# Patient Record
Sex: Male | Born: 1935 | Race: White | Hispanic: No | Marital: Married | Smoking: Never smoker
Health system: Southern US, Community
[De-identification: ages and names within clinical notes are randomized; demographics above are authoritative.]

## PROBLEM LIST (undated history)

## (undated) DIAGNOSIS — N3001 Acute cystitis with hematuria: Secondary | ICD-10-CM

## (undated) DIAGNOSIS — N400 Enlarged prostate without lower urinary tract symptoms: Secondary | ICD-10-CM

## (undated) DIAGNOSIS — E785 Hyperlipidemia, unspecified: Secondary | ICD-10-CM

## (undated) DIAGNOSIS — G9340 Encephalopathy, unspecified: Secondary | ICD-10-CM

## (undated) DIAGNOSIS — R5383 Other fatigue: Secondary | ICD-10-CM

## (undated) DIAGNOSIS — M159 Polyosteoarthritis, unspecified: Secondary | ICD-10-CM

## (undated) DIAGNOSIS — D72825 Bandemia: Secondary | ICD-10-CM

## (undated) DIAGNOSIS — Z9181 History of falling: Secondary | ICD-10-CM

## (undated) DIAGNOSIS — I10 Essential (primary) hypertension: Secondary | ICD-10-CM

## (undated) DIAGNOSIS — R7303 Prediabetes: Secondary | ICD-10-CM

## (undated) DIAGNOSIS — R4182 Altered mental status, unspecified: Secondary | ICD-10-CM

## (undated) DIAGNOSIS — R5381 Other malaise: Secondary | ICD-10-CM

## (undated) DIAGNOSIS — D638 Anemia in other chronic diseases classified elsewhere: Secondary | ICD-10-CM

## (undated) DIAGNOSIS — R001 Bradycardia, unspecified: Secondary | ICD-10-CM

## (undated) DIAGNOSIS — I119 Hypertensive heart disease without heart failure: Secondary | ICD-10-CM

## (undated) DIAGNOSIS — G4733 Obstructive sleep apnea (adult) (pediatric): Secondary | ICD-10-CM

## (undated) DIAGNOSIS — A419 Sepsis, unspecified organism: Secondary | ICD-10-CM

## (undated) DIAGNOSIS — Z79899 Other long term (current) drug therapy: Secondary | ICD-10-CM

## (undated) DIAGNOSIS — R339 Retention of urine, unspecified: Secondary | ICD-10-CM

## (undated) DIAGNOSIS — N390 Urinary tract infection, site not specified: Secondary | ICD-10-CM

## (undated) DIAGNOSIS — I2510 Atherosclerotic heart disease of native coronary artery without angina pectoris: Secondary | ICD-10-CM

## (undated) DIAGNOSIS — K208 Other esophagitis: Secondary | ICD-10-CM

## (undated) DIAGNOSIS — N319 Neuromuscular dysfunction of bladder, unspecified: Secondary | ICD-10-CM

## (undated) DIAGNOSIS — G20 Parkinson's disease: Secondary | ICD-10-CM

## (undated) HISTORY — DX: Atherosclerotic heart disease of native coronary artery without angina pectoris: I25.10

## (undated) HISTORY — PX: PROSTATE SURGERY: SHX751

## (undated) HISTORY — DX: Bradycardia, unspecified: R00.1

## (undated) HISTORY — DX: Altered mental status, unspecified: R41.82

## (undated) HISTORY — DX: Retention of urine, unspecified: R33.9

## (undated) HISTORY — DX: History of falling: Z91.81

## (undated) HISTORY — DX: Benign prostatic hyperplasia without lower urinary tract symptoms: N40.0

## (undated) HISTORY — PX: INGUINAL HERNIA REPAIR: SUR1180

## (undated) HISTORY — DX: Hypertensive heart disease without heart failure: I11.9

## (undated) HISTORY — DX: Other long term (current) drug therapy: Z79.899

## (undated) HISTORY — DX: Obstructive sleep apnea (adult) (pediatric): G47.33

## (undated) HISTORY — DX: Other esophagitis: K20.8

## (undated) HISTORY — DX: Hyperlipidemia, unspecified: E78.5

## (undated) HISTORY — DX: Essential (primary) hypertension: I10

## (undated) HISTORY — DX: Anemia in other chronic diseases classified elsewhere: D63.8

## (undated) HISTORY — DX: Prediabetes: R73.03

## (undated) HISTORY — DX: Other fatigue: R53.83

## (undated) HISTORY — DX: Encephalopathy, unspecified: G93.40

## (undated) HISTORY — DX: Neuromuscular dysfunction of bladder, unspecified: N31.9

## (undated) HISTORY — DX: Polyosteoarthritis, unspecified: M15.9

## (undated) HISTORY — PX: CORONARY ANGIOPLASTY WITH STENT PLACEMENT: SHX49

## (undated) HISTORY — DX: Urinary tract infection, site not specified: N39.0

## (undated) HISTORY — DX: Bandemia: D72.825

## (undated) HISTORY — DX: Other malaise: R53.81

## (undated) HISTORY — PX: KNEE SURGERY: SHX244

## (undated) HISTORY — DX: Sepsis, unspecified organism: A41.9

## (undated) HISTORY — DX: Acute cystitis with hematuria: N30.01

## (undated) HISTORY — DX: Parkinson's disease: G20

## (undated) HISTORY — PX: CATARACT EXTRACTION, BILATERAL: SHX1313

---

## 2005-06-22 ENCOUNTER — Inpatient Hospital Stay (HOSPITAL_COMMUNITY): Admission: AD | Admit: 2005-06-22 | Discharge: 2005-06-25 | Payer: Self-pay | Admitting: Cardiology

## 2005-06-23 ENCOUNTER — Ambulatory Visit: Payer: Self-pay | Admitting: Cardiology

## 2005-07-05 ENCOUNTER — Ambulatory Visit: Payer: Self-pay | Admitting: Cardiology

## 2005-07-24 ENCOUNTER — Ambulatory Visit: Payer: Self-pay | Admitting: Internal Medicine

## 2005-09-18 ENCOUNTER — Ambulatory Visit: Payer: Self-pay | Admitting: Cardiology

## 2005-12-05 ENCOUNTER — Ambulatory Visit: Payer: Self-pay | Admitting: Cardiology

## 2006-03-15 ENCOUNTER — Ambulatory Visit: Payer: Self-pay | Admitting: Cardiology

## 2006-07-17 ENCOUNTER — Ambulatory Visit: Payer: Self-pay | Admitting: Cardiology

## 2006-07-30 ENCOUNTER — Inpatient Hospital Stay (HOSPITAL_COMMUNITY): Admission: RE | Admit: 2006-07-30 | Discharge: 2006-08-02 | Payer: Self-pay | Admitting: Orthopedic Surgery

## 2006-07-30 ENCOUNTER — Ambulatory Visit: Payer: Self-pay | Admitting: Cardiology

## 2007-04-18 ENCOUNTER — Ambulatory Visit: Payer: Self-pay | Admitting: Cardiology

## 2007-05-27 ENCOUNTER — Inpatient Hospital Stay (HOSPITAL_COMMUNITY): Admission: RE | Admit: 2007-05-27 | Discharge: 2007-05-29 | Payer: Self-pay | Admitting: Orthopedic Surgery

## 2007-09-20 IMAGING — CR DG CHEST 2V
2 series · 2 of 2 positions shown · non-contrast
Comparison: None.

CLINICAL DATA: Preop radiograph right total knee arthroplasty.  
 CHEST - 2 VIEW:

[view not recorded (1 of 2)]
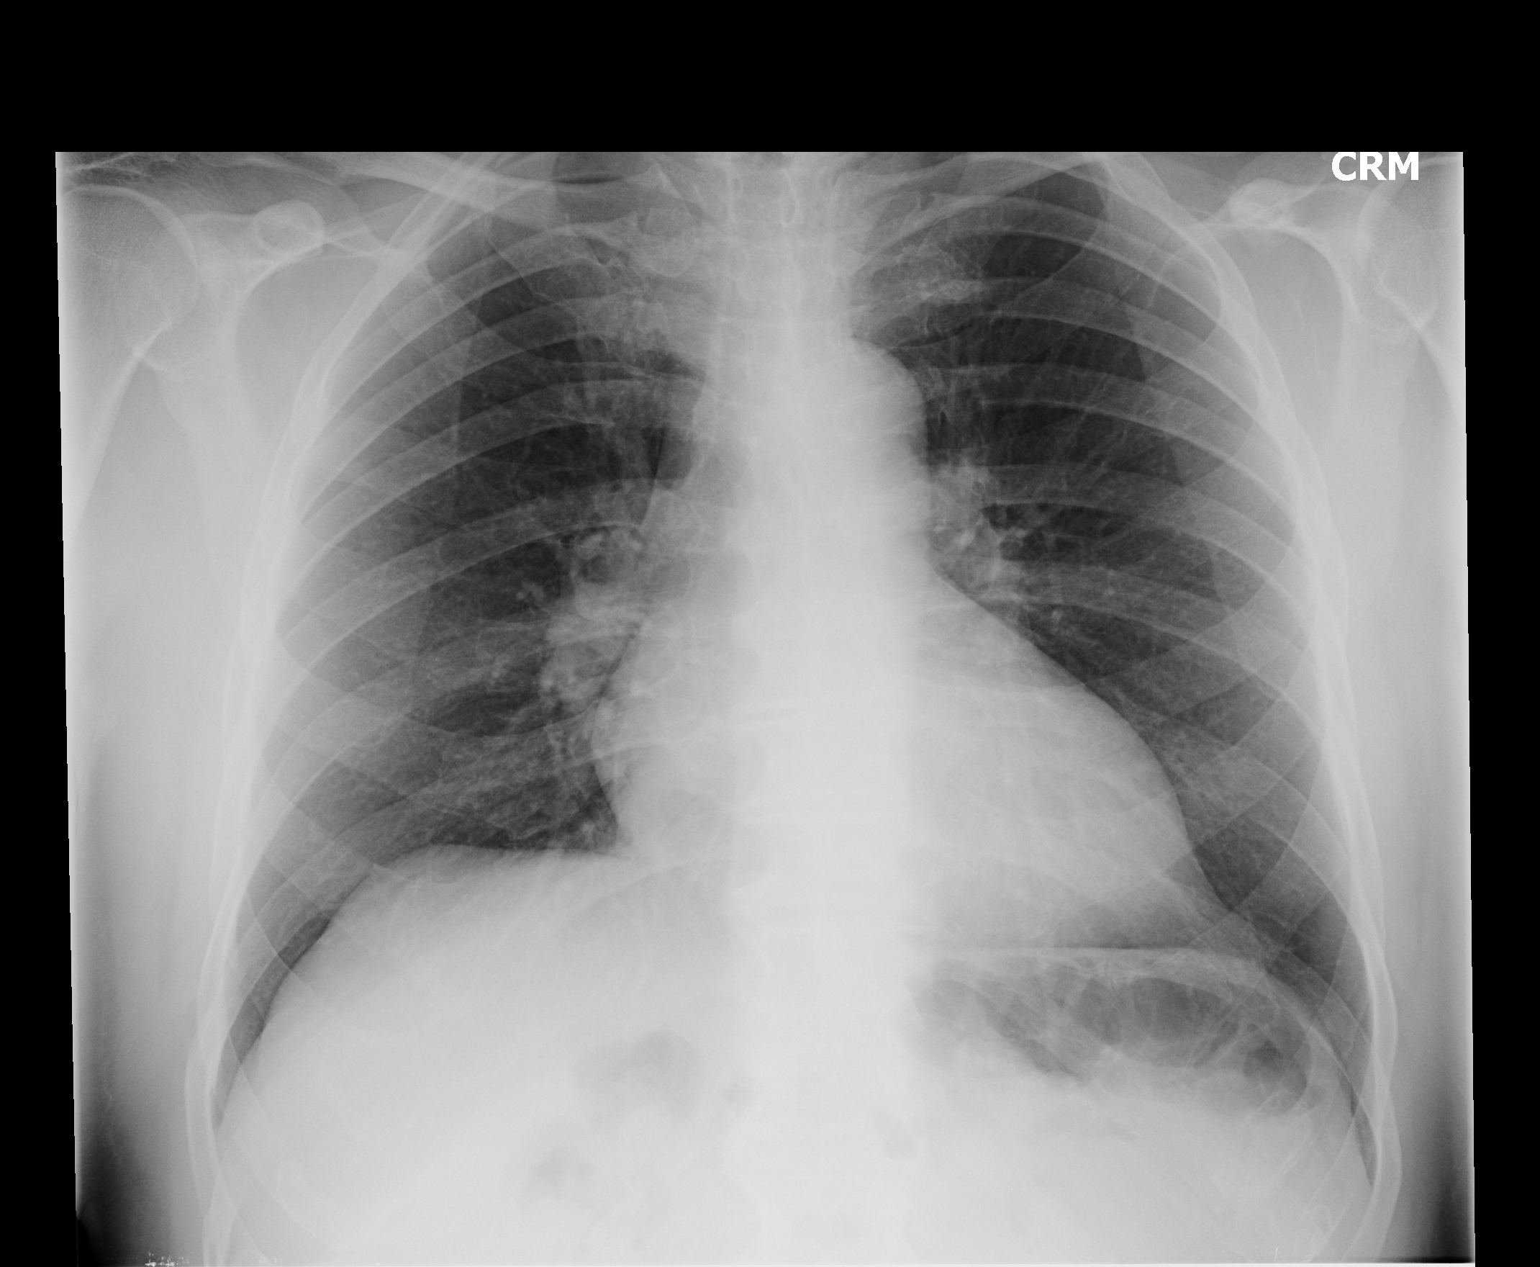

[view not recorded (2 of 2)]
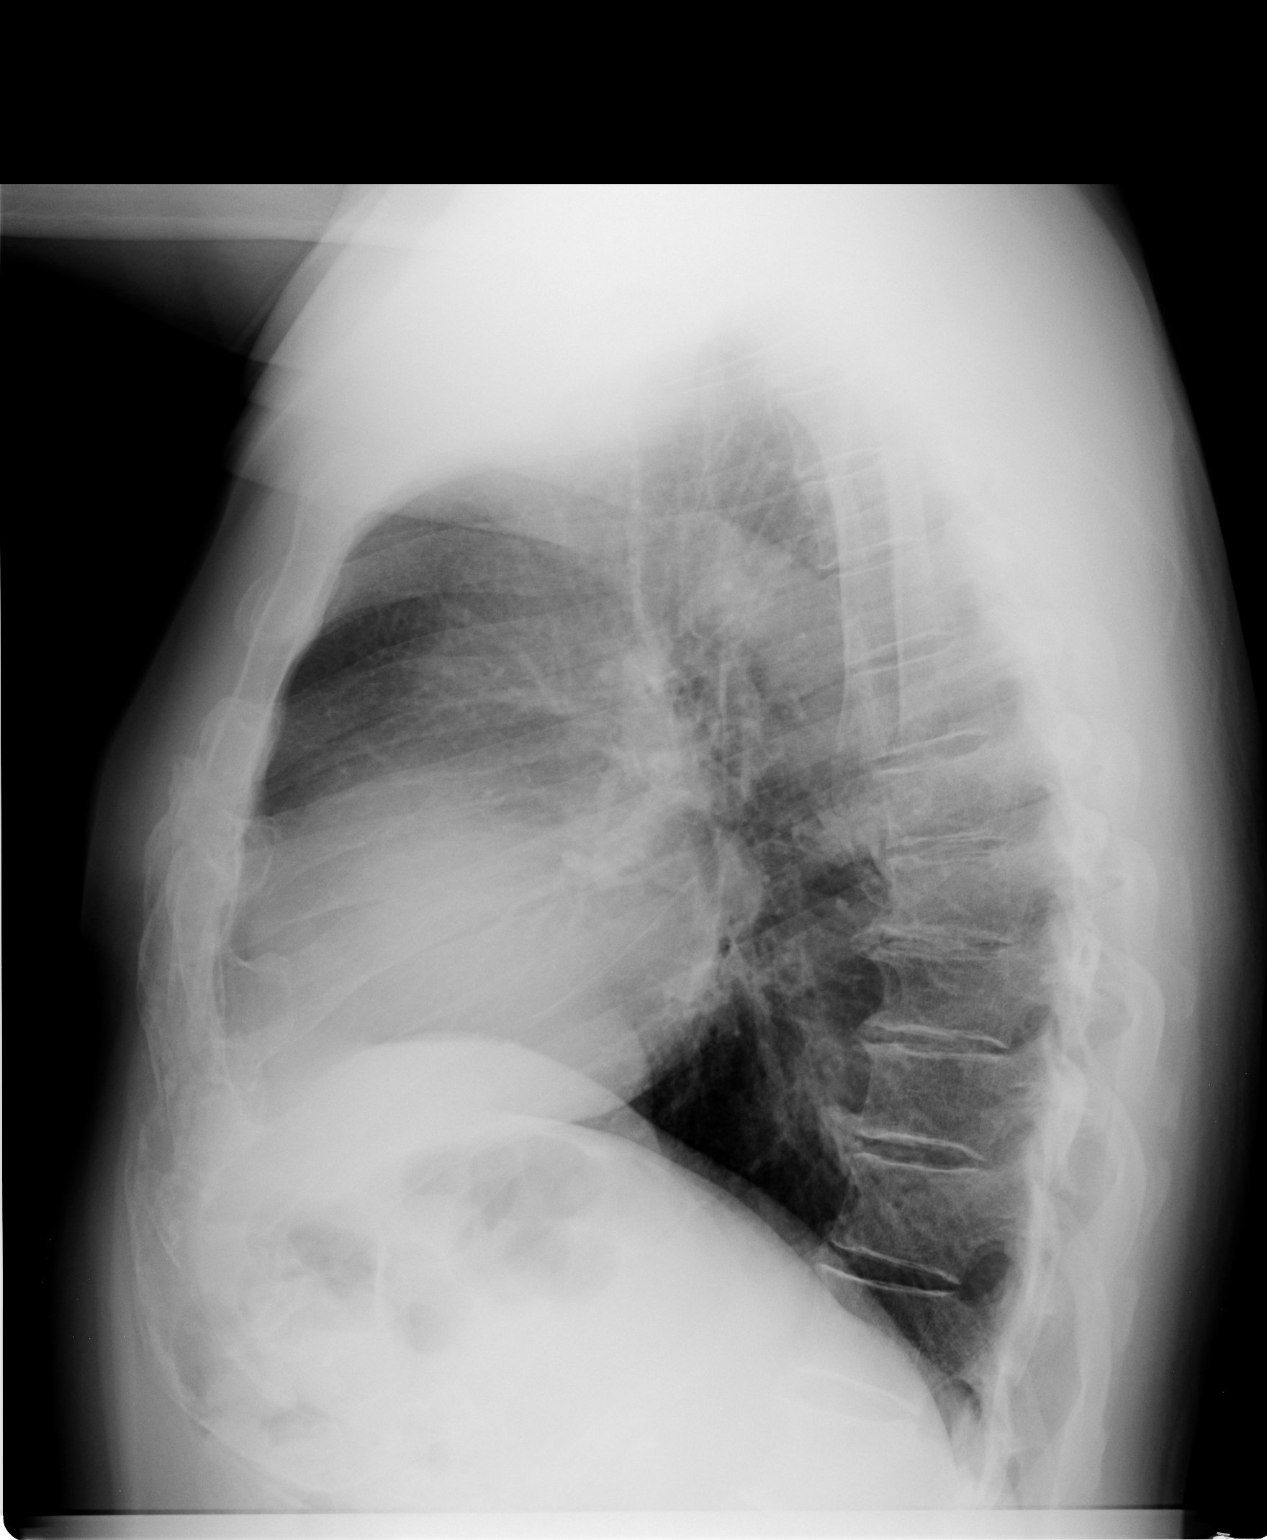

[2 of 2 positions shown; findings below may reference images not displayed]

FINDINGS: The heart size is mildly enlarged.  There is no effusions and no interstitial edema.  No air space opacities are noted.  
 There is mild chronic bronchitic changes noted.
IMPRESSION: 1.  No active cardiopulmonary disease. 
 2.  Mild cardiomegaly.  No failure.

## 2011-03-14 NOTE — Op Note (Signed)
NAME:  [name] [name], [name] [name]                 ACCOUNT NO.:  [id number]   MEDICAL RECORD NO.:  [id number]          PATIENT TYPE:  INP   LOCATION:  2550                         FACILITY:  [facility]   PHYSICIAN:  Robert A. Thurston Hole, M.D. DATE OF BIRTH:  1934/01/11   DATE OF PROCEDURE:  05/27/2007  DATE OF DISCHARGE:                               OPERATIVE REPORT   PREOPERATIVE DIAGNOSIS:  Left knee degenerative joint disease.   POSTOPERATIVE DIAGNOSIS:  Left knee degenerative joint disease.   PROCEDURE:  Left total knee replacement using DePuy cemented total knee  system with #4 cemented femur, #4 cemented tibia with 12.5 mm  polyethylene RP tibial spacer, 35 mm polyethylene cemented patella.   SURGEON:  Elana Alm. Thurston Hole, M.D.   ASSISTANT:  Julien Girt, P.A.   ANESTHESIA:  General.   OPERATIVE TIME:  1 hour 30 minutes.   COMPLICATIONS:  None.   DESCRIPTION OF PROCEDURE:  Mr. Liddy is brought to operating room on May 27, 2007 after a femoral nerve block was placed in holding by  anesthesia.  He is placed on the operative table in the supine  position.Marland Kitchen  He received Ancef 2 grams IV preoperatively for prophylaxis.  After being placed under general anesthesia, his left knee was examined.  Range of motion from minus 5 to 125 degrees with mild varus deformity.  Knee stable.  Ligamentous exam with normal patellar tracking.  He had a  Foley catheter placed under sterile conditions.  His left leg was then  prepped using sterile DuraPrep and draped using sterile technique.  Leg  was exsanguinated and thigh tourniquet elevated to 365 mm.   Initially through a 15 cm longitudinal incision based over the patella,  initial exposure was made.  Underlying subcutaneous tissues were incised  along with skin incision.  A median arthrotomy was performed revealing  an excessive amount of normal-appearing joint fluid.  The articular  surfaces were inspected.  He had grade 4 changes medially, grade 3  changes laterally and grade 3 and 4 changes in the patellofemoral joint.  Osteophytes removed from the femoral condyles and tibial plateau.  The  medial and lateral meniscal remnants were removed as well as the  anterior cruciate ligament.  An intramedullary drill was then drilled up  the femoral canal for placement of the distal femoral cutting jig which  was placed in the appropriate amount of rotation and the distal 12 mm  cut was made.  The distal femur was incised.  The #4 was found be the  appropriate size.  The #4 cutting jig was placed and these cuts were  made.  After this was done, the tibial spines were removed with an  oscillating saw.  Intramedullary drill was drilled down the tibial canal  for placement of proximal tibial cutting jig which was placed in the  appropriate amount of rotation.  The proximal 4 mm cut was made based  off the medial or lower side.  Spacer blocks were then placed in flexion  and extension.  The 12.5 mm blocks were placed,  gave excellent  balancing, excellent  stability, and excellent correction of his flexion  and varus deformities. The #4 tibial base plate trial was then placed on  the cut  tibial surface with an excellent fit and the keel cut was made.  The PCL box cutter was then placed on the distal femur and these cuts  were made.  At this point, the #4 femoral trial was placed.  A #4 tibial  base plate trial with a 12.5 mm polyethylene RP tibial spacer, knee was  reduced, taken through range of motion from 0 to 125 degrees with  excellent stability and excellent correction of his flexion and varus  deformities.  The patella was incised in a resurfacing 10 mm cut was  made and three locking holes placed for a 35 mm patella.  The patella  trial was placed and patellofemoral tracking was evaluated and found to  be normal.  At this point it was felt that all trial components were  excellent size and stability.  They were then removed.  The knee  was  then jet lavaged, irrigated with  3 L of saline.  The proximal tibia was  then exposed.  The #4 tibial base plate with cement backing was hammered  into position with an excellent fit with excess cement being removed  from around the edges.  The #4 femoral component with cement backing was  hammered into position also with an excellent fit with excess cement  being removed from around the edges.  The 12.5 mm polyethylene RP tibial  spacer was placed on the tibial base plate, knee reduced, taken through  range of motion from 0 to 125 degrees with excellent stability and  excellent correction of his flexion and varus deformities.  The 35 mm  polyethylene cement backed patella was then placed in its position and  held there with a clamp.  After this cement hardened, patellofemoral  tracking was again evaluated and this was found be normal.  At this  point, it was felt that all components were excellent size, fit and  stability.  The wound was further irrigated with saline.  Heme  tourniquet was released.  Hemostasis obtained with cautery.  The  arthrotomy was then closed with #1 Ethilon suture over two medium  Hemovac drains.  Subcutaneous tissues closed with 0 and 2-0 Vicryl.  Subcuticular layer closed with 4-0 Monocryl.  Steri-Strips, long-leg  dressing were applied.  The patient then awakened, extubated and taken  to recovery in stable condition.  Needle and sponge counts correct x2 at  the end of the case.  Neurovascular status normal as well.      Robert A. Thurston Hole, M.D.  Electronically Signed     RAW/MEDQ  D:  05/27/2007  T:  05/27/2007  Job:  [id number]

## 2011-03-14 NOTE — Letter (Signed)
April 18, 2007    Robert A. Thurston Hole, M.D.  [address]  [address]  [place], [place] [address]  FAX:  [phone]  Attn:  Olegario Messier   RE:  [name], [name]  MRN:  [id number]  /  DOB:  [birth year]   Dear Dr. Thurston Hole:   Thank you for referring [name] [name] for preoperative cardiac evaluation  before planned left total knee replacement.  [name] [name] is a 77 year old  gentleman who suffered unstable angina in August of 2006.  I placed a  drug-eluting stent in the mid portion of his circumflex artery.  He has  done very nicely since then.  He is able to go up and down a flight of  stairs and mow his lawn without any dyspnea or chest discomfort.  He  tolerated a right total knee replacement in October without any cardiac  compromise.  He denies any symptoms of orthopnea, paroxysmal nocturnal  dyspnea, palpitations, syncope, pre-syncope, claudication.   PAST MEDICAL HISTORY:  Remarkable for atherosclerotic coronary artery  disease, which is status post non-ST-elevation myocardial infarction in  August of 2006.  He has hypertension, hypercholesterolemia,  osteoarthritis of the knees, and an esophageal stricture.  He has no  known drug allergies.   CURRENT MEDICATIONS:  1. Aspirin 81 mg, which he takes 4 times daily.  2. Fish oil 1000 mg daily.  3. Lisinopril 20 mg daily.  4. Toprol XL 25 mg daily.  5. Crestor 40 mg daily.  6. Plavix 75 mg daily.  7. Vitamin B12 daily.  8. He has sublingual nitroglycerin, which he has not used.   The patient is retired and married.  He does not smoke and drinks  minimal alcohol.   FAMILY HISTORY:  Noncontributory.   REVIEW OF SYSTEMS:  Negative in detail, except as above, with the  exception of left knee discomfort with walking and standing.   PHYSICAL EXAMINATION:  He is generally well-appearing in no distress.  Heart rate is 55, blood pressure 167/73, and weight of 184 pounds.  He has no jugular venous distension, thyromegaly, or lymphadenopathy.  Respiratory effort  is normal.  Lungs are clear to auscultation.  He has a nondisplaced point of maximal impulse.  There is a regular rate  and rhythm without murmur, rub, or gallop.  ABDOMEN:  Soft, nondistended, nontender.  There is no  hepatosplenomegaly.  Bowel sounds are normal.  There is no abdominal  pulsatile mass.  EXTREMITIES:  Warm without clubbing, cyanosis, edema, or ulceration.  Carotid pulses are 2+ bilaterally without bruit.  He is alert and  oriented x3 with normal neurological exam and normal affect.  SKIN:  Normal.  HEENT:  Normal.  MUSCULOSKELETAL:  Normal, except for some crepitus with motion of his  left knee.   ELECTROCARDIOGRAM:  Demonstrates sinus bradycardia and is otherwise  normal.   SUMMARY:  [name] [name] is doing nicely from a cardiac viewpoint.  He is,  therefore, at low risk for perioperative cardiac event.  No further risk  stratification is necessary.  He should, again, be maintained on his  Toprol XL, including taking it on the morning of the procedure.  He  should also be maintained on his Crestor.  His Plavix should be  discontinued 7 days prior to the procedure and resumed as soon as safe  thereafter.  If acceptable from a surgical viewpoint, I would continue  his aspirin.  If not, discontinue it at the same time as the Plavix.   Thank you for  the opportunity to participate in his care once again.    Sincerely,      Salvadore Farber, MD  Electronically Signed    WED/MedQ  DD: 04/18/2007  DT: 04/18/2007  Job #: [id number]

## 2011-03-14 NOTE — Op Note (Signed)
NAME:  [name], [name]                 ACCOUNT NO.:  [id number]   MEDICAL RECORD NO.:  [id number]          PATIENT TYPE:  INP   LOCATION:  2550                         FACILITY:  [facility]   PHYSICIAN:  Robert A. Thurston Hole, M.D. DATE OF BIRTH:  12-22-1933   DATE OF PROCEDURE:  DATE OF DISCHARGE:                               OPERATIVE REPORT   Audio too short to transcribe (less than 5 seconds)      Robert A. Thurston Hole, M.D.     RAW/MEDQ  D:  05/27/2007  T:  05/27/2007  Job:  [id number]

## 2011-03-17 NOTE — Consult Note (Signed)
NAME:  [name] [name], [name] [name]                 ACCOUNT NO.:  [id number]   MEDICAL RECORD NO.:  [id number]          PATIENT TYPE:  INP   LOCATION:  5036                         FACILITY:  [facility]   PHYSICIAN:  Sigmund I. Patsi Sears, M.D.DATE OF BIRTH:  [birth year]   DATE OF CONSULTATION:  DATE OF DISCHARGE:                                   CONSULTATION   SUBJECTIVE:  This is a 77 year old male, consulted today because of gross  hematuria postoperatively.  The patient had a right total knee replacement  this morning, with Foley catheter placed preoperatively.  The Foley catheter  placement was somewhat difficult, but was not considered traumatic.  No  blood was noted.  The Foley balloon inflated easily, and clear urine was  obtained for the entire procedure.  When the cuff was deflated around the  leg, the patient began to immediately have gross hematuria with a few clots.  Urology is consulted.   The past history is significant for:  1. TURP by Dr. Dannette Barbara in the distant past.  2. Microhematuria, status post IVP and cystoscopy by Dr. Annabell Howells in 2001,      with the finding of post TURP prostate, but no evidence of bladder,      stone, tumor, or diverticular formation.   The patient notes that he has a history of some mild decrease in his urinary  stream, but no gross hematuria, no frequency, urgency, or dysuria.   Past history is also significant for significant cardiac disease with  unstable angina.  He has a drug-eluting stent in the mid portion of the  circumflex coronary artery (Dr. Samule Ohm).  The patient has been on Plavix and  aspirin, but was asked to continue aspirin throughout the perioperative  course, but to discontinue Plavix 7 days prior to his total knee surgery,  per Dr. Thurston Hole.   MEDICATIONS:  1. Aspirin 81 mg 4 times a day.  2. Fish oil.  3. Zantac.  4. Lisinopril 20 mg a day.  5. Toprol-XL 25 mg a day.  6. Crestor 40 mg per day.  7. Plavix 75 mg per day (stopped).   ALLERGIES:  NONE.   PAST HISTORY:  1. Coronary artery disease.  2. BPH, post TURP.  3. Hypertension.  4. Hypercholesterolemia.  5. Esophageal stricture.  6. Osteoarthritis of both knees.  7. Cardiac disease with ejection fraction of 50%.   SOCIAL HISTORY:  The patient is retired and married.  The patient uses  minimal alcohol, and no tobacco.   FAMILY HISTORY:  Noncontributory.   REVIEW OF SYSTEMS:  Significant for some decrease in the size and force of  his urinary stream, otherwise review of systems is noncontributory.   PHYSICAL EXAMINATION:  GENERAL:  Shows a well-developed, well-nourished  white male postoperatively, in a knee brace, resting comfortably in bed.  Foley catheter is in place, with gross hematuria noted.  VITAL SIGNS:  The blood pressure is 120/78, heart rate 55.  NECK:  Supple, nontender.  CHEST:  Clear to P&A.  ABDOMEN:  Soft, positive bowel sounds.  No organomegaly or masses.  GENITOURINARY:  Shows no evidence of edema.  There is no bruising.  The  penis is uncircumcised.  The glans is normal.  Foley catheter is removed.  Testicles measure 3 x 4 cm and nontender.  RECTAL:  Examination not indicated this examination.  SKIN:  Normal.  PSYCHOLOGIC:  Shows normal orientation to time, person, and place.   Flexible cystoscopy was accomplished, it shows the patient has regrowth of  prostate, with bladder neck contracture.  The flexible scope is negotiated  over the contractured area, guide wire is placed.  An 6 Council catheter is  passed over the guide wire into the bladder, and 10 mL of saline are placed  in the balloon.  The patient tolerated the procedure well.  I have discussed  the case with Dr. Thurston Hole.  He will continue his Foley catheterization until  he is able to stand to void.  We will start him on Flomax 0.4 mg 1 p.o. per  day.      Sigmund I. Patsi Sears, M.D.  Electronically Signed     SIT/MEDQ  D:  07/30/2006  T:  07/30/2006  Job:   [id number]   cc:   Molly Maduro A. Thurston Hole, M.D.

## 2011-03-17 NOTE — Discharge Summary (Signed)
NAME:  [name] [name], [name] [name]                 ACCOUNT NO.:  [id number]   MEDICAL RECORD NO.:  [id number]          PATIENT TYPE:  INP   LOCATION:  5036                         FACILITY:  [facility]   PHYSICIAN:  Robert A. Thurston Hole, M.D. DATE OF BIRTH:  [birth year]   DATE OF ADMISSION:  07/30/2006  DATE OF DISCHARGE:  08/02/2006                               DISCHARGE SUMMARY   ADMISSION DIAGNOSIS:  1. End stage degenerative joint disease, right knee.  2. Hypertension.  3. Coronary artery disease.  4. High cholesterol.   DISCHARGE DIAGNOSIS:  1. End stage degenerative joint disease.  2. Hypertension.  3. Coronary artery disease.  4. High cholesterol.  5. Postop blood loss anemia.  6. History of old myocardial infarction.  7. Gastroesophageal reflux with esophageal stricture.  8. Hematuria.  9. Orthostatic hypotension.  10.Benign prostatic hypertrophy and urethral stricture.   HISTORY OF PRESENT ILLNESS:  The patient is a 77 year old white male  with a history of end stage DJD of both knees.  He has failed  conservative care including anti-inflammatories, intra-articular  cortisone injections, and physical therapy.  He understands the risks,  benefits and possible complications of a right total knee replacement  and is without question.   PROCEDURES IN HOUSE:  July 30, 2006, the patient underwent a right  total knee replacement by Dr. Thurston Hole, and a femoral nerve block by  anesthesia, and a cystoscopy with catheter placement by urology.  He  tolerated all procedures well and was admitted postop for pain control,  DVT prophylaxis, and physical therapy, as well as monitoring of his  gross hematuria.   HOSPITAL COURSE:  The catheter was initially placed preop and urine was  clear at the time of catheter placement. Prior to moving from the  operating table to the recovery room, the patient was noted to have  gross hematuria and urology was consulted.  They did a flexible  cystoscopy and placed  an 18 Coude catheter. He was started on Flomax.  Cardiology was consulted postop due to his significant cardiac history.  The patient remains on Plavix per cardiology's recommendations despite  the patient's gross hematuria. The patient was also placed on Coumadin  for DVT prophylaxis despite his hematuria, due to his significant risk  for DVT due to total joint replacement and upcoming immobility.  Postop  day one, the patient had a T-max of 100.3.  His hemoglobin was 10.1, INR  was 1.2, white cell count was 8.8.  He was alert and oriented.  His  wound was dressed and had no excess drainage.  His drain was removed.  He tolerated the CPM 0 to 90 degrees.  He had brisk capillary refill.  He did have a drug reaction of hives to Robaxin so his Robaxin was  discontinued.  His lisinopril was held because his blood pressure was  98/47.  His dressing was changed.  His PCA was discontinued and physical  therapy was begun.  On postop day two, the patient had no shortness of  breath.  His hemoglobin was 9.  T-max was 100.6. His INR was 1.2.  He  tolerated the CPM 0 to 60 degrees.  He had a slight bit of wheezing.  He  had brisk capillary refill with sensation intact. His Coumadin was held  due to his significant hematuria and blood clots.  His Foley was  discontinued and he was able to pass urine, but did have significant  blood clots. Therefore, urology recommended holding his Coumadin until  his urine clears. He will continue on his Plavix.  Postop day three, the  patient was doing well and wanted to go home. Despite his clots in his  urine, we knew that these were due to his urethral stricture, being  treated by cystoscopy.  He received 2 units of blood and on postop day  three, his hemoglobin was 11.1.  His INR was 1.3. He did complain of  some right medial calf pain with a positive Homans'. A Doppler was  ordered and there was no DVT.  So, he was discharged to home in stable  condition with  instructions to use the CPM 0 to 60 degrees 8 hours a  day, increasing by 5 degrees a day, instructions on stretching his right  knee on a folded pillow under his ankle for 30 minutes twice a day.  Instructions to restart his Coumadin one day after he no longer has  blood in his urine.  He has home health physical therapy, occupational  therapy, and nursing working with him.  He is weight bearing as  tolerated on a regular diet.   DISCHARGE MEDICATIONS:  Coumadin 5 mg daily to begin one day after he  has no blood in his urine, Tylenol 325 mg 2 tablets every 4 hours for  pain, Plavix 75 mg 1 tablet a day, lisinopril 20 mg 1 tablet a day,  Crestor 40 mg 1 tablet a day, Toprol XL 25 mg daily, aspirin 81 mg  daily.   FOLLOW UP:  He will follow up in Dr. Sherene Sires office on August 13, 2006.  He will follow up with urology next week.  He will follow up with  cardiology at his regularly scheduled appointment.      Kirstin Shepperson, P.A.      Robert A. Thurston Hole, M.D.  Electronically Signed    KS/MEDQ  D:  10/03/2006  T:  10/03/2006  Job:  [id number]

## 2011-03-17 NOTE — Discharge Summary (Signed)
NAME:  [name] [name], [name] [name]                 ACCOUNT NO.:  [id number]   MEDICAL RECORD NO.:  [id number]          PATIENT TYPE:  INP   LOCATION:  5006                         FACILITY:  [facility]   PHYSICIAN:  Robert A. Thurston Hole, M.D. DATE OF BIRTH:  [birth year]   DATE OF ADMISSION:  05/27/2007  DATE OF DISCHARGE:  05/29/2007                               DISCHARGE SUMMARY   ADMITTING DIAGNOSES:  1. End-stage degenerative joint disease, left knee.  2. Coronary artery disease.  3. Hypertension.  4. High cholesterol.  5. Status post cardiac cath with a drug eluding stent.   DISCHARGE DIAGNOSES:  1. End-stage degenerative joint disease, left knee status post left      total knee replacement.  2. Coronary artery disease status post cardiac cath with a drug-      eluting stent.  3. Hypertension.  4. High cholesterol.  5. Gastroesophageal reflux.   HISTORY OF PRESENT ILLNESS:  The patient is a 77 year old white male  with a history of end-stage DJD of both knees.  He underwent a right  total knee replacement and without complication and now is here for his  left.  He understands the risks, benefits and possible complications of  a total knee replacement and is without question.   PROCEDURES IN-HOUSE:  On May 27, 2007, the patient underwent a left  total knee replacement and a left femoral nerve block by anesthesia.   HOSPITAL COURSE:  He was admitted postoperatively for pain control, DVT  prophylaxis and physical therapy.  CPM was started zero to 40 degrees,  on post-op day zero.  Post-op day #1 hemoglobin was 11.8.  White cell  count was slightly elevated at 12.1.  He was metabolically stable. The  PCA was discontinued.  His Foley was discontinued.  His IV was saline  locked.  He had up with physical therapy, ambulated 20 feet, has all of  his durable medical goods at home, due to being status post right total  knee.  Post-op day #2 hemoglobin was 11.1, T- max of 99.8.  He was  metabolically  stable.  White count was back within normal limits at 9.8.  He tolerated CPM zero to 80 degrees.  His dressing was changed.  He was  given a Dulcolax suppository to help with constipation.  He was  discharged to home in stable condition, weightbearing as tolerated, on a  regular diet.   DISCHARGE MEDICATIONS:  1. Oxy-IR 5 mg 1 to 2 q. 4-6 hours p.r.n. pain.  2. Robaxin 500 mg one q.4-6 hours p.r.n. muscle spasm.  3. Coumadin 5 mg 1-1/2 tablets daily.  4. Colace 100 mg twice a day.  5. Aspirin 81 mg four times a day.  Take one time a day while he is on      Coumadin.  Can resume his regular home medication dose, when he has      stopped his Coumadin at 4 weeks post-op.  6. Fish oil daily.  7. Lisinopril 20 mg daily.  8. Toprol XL 25 mg daily.  9. Crestor 40 mg daily.  10.Plavix 75  mg daily.  11.Vitamin B12 daily.   His been instructed to use his CPM 8 hours a day, elevate his left heel  on a folded pillow 30 minutes a day.  He will follow up with Dr. Wyline Mood  on June 10, 2007.  He and his wife have been instructed to change  dressing daily.  Call with increased redness, increased pain, increased  swelling or temperature greater than 101.      Kirstin Shepperson, P.A.      Robert A. Thurston Hole, M.D.  Electronically Signed    KS/MEDQ  D:  07/24/2007  T:  07/25/2007  Job:  [id number]

## 2011-03-17 NOTE — Discharge Summary (Signed)
NAME:  Wissner, Andric                 ACCOUNT NO.:  [id number]   MEDICAL RECORD NO.:  [id number]          PATIENT TYPE:  OIB   LOCATION:  3735                         FACILITY:  [facility]   PHYSICIAN:  Thomas C. Wall, M.D.   DATE OF BIRTH:  Mar 24, 1934   DATE OF ADMISSION:  06/22/2005  DATE OF DISCHARGE:  06/25/2005                                 DISCHARGE SUMMARY   DISCHARGE DIAGNOSES:  1.  Non-ST segment elevation myocardial infarction.  2.  Hypertension.  3.  Hyperlipidemia.  4.  Family history of coronary artery disease.  5.  History of esophageal stricture.  6.  History of right knee arthroscopic surgery and osteoarthritis.  7.  Fever and leukocytosis, felt secondary to myocardial infarction.  8.  Anemia post procedure with a hemoglobin of 11.3. and hematocrit of 33.2.  9.  Hyperglycemia, followup with Dr. Sherlyn Lick. Hemoglobin A1C 6.0.  10. Left ventricular dysfunction with an ejection fraction of 50% at      catheterization.   HOSPITAL COURSE:  Mr. Heupel is a 77 year old male who presented at [facility]  about 24 hours after onset of mid epigastric and sub-sternal chest pain. His  EKG was abnormal and his cardiac enzymes were elevated. He was transferred  urgently to [facility] for further evaluation and treatment.   Mr. Wanat was taken emergently to the catheterization lab. His cardiac  catheterization showed a total circumflex, which was treated with PTCA and a  Taxus stent, reducing the stenosis to zero. He also had a PDA with a 99%  stenosis. There were LAD to PDA collaterals. A 90% diagonal was also noted  with medical therapy the best option for these vessels.   His EF was 50% at catheterization and an ace inhibitor was added to his  medication regimen with Lisinopril at 20 mg a day. A lipid profile was  performed, which showed a total cholesterol of 105, triglycerides 83, HDL  37, LDL 51. He had been on Crestor prior to admission for hyperlipidemia and  this was  continued. His Toprol XL was increased to 75 mg q. day and he  tolerated this well.   He had a fever post catheterization, which peaked at 100.3. This gradually  resolved over the next 48 hours. He had some leukocytosis with a white count  initially of 14.4. He had no dysuria and no cough. It was felt that the  fever and the leukocytosis were secondary to myocardial infarction.   Mr. Marti was enrolled in the Triton study and continued on the Triton  medication. He was seen by cardiac rehabilitation and referred for  outpatient cardiac rehabilitation as well. Mr. Duhe was evaluated by Dr. Daleen Squibb  and considered stable for discharge on June 25, 2005.   DISCHARGE INSTRUCTIONS:  1.  His activity level is to be increased gradually.  2.  He is to continue on the Triton study medication.  3.  He will be contacted by the [employer].  4.  He is to followup with radiology as well as primary care.   DISCHARGE MEDICATIONS:  1.  Nitroglycerin 0.4 mg p.r.n.  2.  Crestor 20 mg q. day.  3.  Lisinopril 20 mg q. day.  4.  Toprol XL 75 mg q. day.  5.  Coated aspirin 325 mg q. day.      Theodore Demark, P.A. [facility]      Thomas C. Wall, M.D.  Electronically Signed    RB/MEDQ  D:  08/22/2005  T:  08/22/2005  Job:  [id number]   cc:   Harl Bowie, M.D.  Fax: [phone]   Salvadore Farber, M.D. [facility]  [address]  [address]  [place]  [place] [address]

## 2011-03-17 NOTE — Letter (Signed)
July 17, 2006     Robert A. Thurston Hole, M.D.  [address], [place] [address]   RE:  [name] [name], [name] [name]  MRN:  [id number]  /  DOB:  1933-12-22   Dear Dr. Thurston Hole:   Thank you for referring [name] [name] for preoperative cardiac consultation.  As  you know, [name] [name] is a 77 year old gentleman scheduled to undergo total knee  replacement on July 30, 2006.  In August of 2006, [name] [name] presented with  unstable angina, and I placed a drug-eluting stent in the mid portion of the  circumflex artery.  [name] [name] has had an uncomplicated cardiac course thereafter.  [name] [name] is at low risk for cardiac complications of his upcoming surgery.  No  further risk stratification is planned.   The drug-eluting stent in his circumflex does carry with it a small risk of  thrombosis during the perioperative period.  For that reason, I have asked  him to continue his aspirin throughout the perioperative period, if that is  acceptable to you.  I have suggested that [name] [name] discontinue his Plavix 7 days  prior to the procedure.  I would ask you to resume it as soon as you feel it  is safe after surgery.   I look forward to following along with you while [name] [name] is in the hospital after  surgery.    Sincerely,    Salvadore Farber, MD   WED/MedQ  DD:  07/17/2006  DT:  07/18/2006  Job #:  [id number]

## 2011-03-17 NOTE — Cardiovascular Report (Signed)
NAME:  [name] [name], [name] [name]                 ACCOUNT NO.:  [id number]   MEDICAL RECORD NO.:  [id number]          PATIENT TYPE:  OIB   LOCATION:  2888                         FACILITY:  [facility]   PHYSICIAN:  Salvadore Farber, M.D. LHCDATE OF BIRTH:  May 06, 1934   DATE OF PROCEDURE:  06/22/2005  DATE OF DISCHARGE:                              CARDIAC CATHETERIZATION   PROCEDURE:  1.  Left heart catheterization.  2.  Left ventriculography.  3.  Coronary angiography.  4.  Drug-eluting stent in the mid-circumflex.   INDICATION:  Mr. Morozov is a 77 year old gentleman without prior history of  cardiovascular disease.  He presents with 36 hours of waxing and waning  chest discomfort, becoming more severe this morning and prompting  presentation to [facility] early this morning.  Therefore, he had  ongoing chest discomfort.  Electrocardiogram demonstrated lateral ST  depressions.  He was treated with aspirin, low-molecular-weight heparin,  single-bolus eptifibatide and nitroglycerin without relief of his pain.  He  was then transferred urgently for angiography with an eye to percutaneous  revascularization.   PROCEDURAL TECHNIQUE:  Informed consent was obtained.  Under 1% lidocaine  local anesthesia, a 6-French sheath was placed in the right common femoral  artery using the modified Seldinger technique.  Diagnostic angiography and  ventriculography were performed using JL4, Williams Right, and pigtail  catheters.  This demonstrated the culprit lesion to be occlusion of the mid-  circumflex.  Decision was made to proceed to percutaneous revascularization.   ANTICOAGULATION:  The patient had received 1 mg/kg of low-molecular-weight  heparin subcutaneously 2 hours prior to the procedure.  He was thus  appropriately anticoagulated.  An additional bolus of eptifibatide was given  to make it double-bolus.  TRITON study drug was administered.   A Voda F-4 guide was advanced over a wire and engaged in  the ostium of the  left main.  A Prowater wire was advanced to the distal vessel with modest  difficulty.  The lesion was then predilated using a 2.5 x 15-mm Maverick at  6 atmospheres for 30 seconds; the vessel promptly reoccluded.  An additional  inflation was then performed for 1 minute with subsequent reocclusion.  Finally, a third predilation was performed, again at 6 atmospheres using the  same balloon for 30 seconds.  With this, a stably open lumen was  established.  The lesion was then stented using a 2.75 x 20-mm TAXUS  deployed at 16 atmospheres.  It was then post-dilated using a 3.0 x 18-mm  PowerSail at 18 atmospheres.  Final angiography demonstrated no residual  stenosis, TIMI-3 flow to the distal vasculature.   The patient tolerated the procedure well and was transferred to the holding  room in stable condition.   COMPLICATIONS:  None.   FINDINGS:  1.  Left main:  Mild diffuse disease without significant stenosis.  2.  LAD:  Moderate-sized vessel giving rise to a single-moderate-sized      diagonal.  The proximal LAD has a 50% stenosis.  The mid LAD has a 60%      stenosis.  The proximal  diagonal has a moderate-length 90% stenosis.  3.  Ramus intermedius:  A very small vessel which is angiographically      normal.  4.  Circumflex:  A large vessel giving rise to 2 obtuse marginals.  There is      a 60% ostial stenosis and it was occluded in its midsection.  The mid-      section occlusion was stented to no residual using a drug-eluting stent.  5.  RCA:  Moderate-sized dominant vessel.  There is a 40% ostial stenosis      and serial 40% and 50% stenoses of the mid-vessel.  The PDA has a 99%      ostial stenosis.  There are LAD-to-PDA collaterals.  6.  LV:  119/13/25.  EF approximately 50% with mid-inferior hypokinesis.  7.  No aortic stenosis or mitral regurgitation.   IMPRESSION/PLAN:  Successful percutaneous intervention on the culprit lesion  of the circumflex with  excellent angiographic result.  I will review films  with colleagues before making a final decision regarding revascularization  of the subtotal occluded posterior descending artery.  In the interim, he  will be continued on the TRITON study drug.  Plavix should not be  administered in open-label fashion.  Aspirin will be administered.  He will  be switched to a high-potency statin and beta blocker and ACE inhibitor  initiated.      Salvadore Farber, M.D. [facility]  Electronically Signed     WED/MEDQ  D:  06/22/2005  T:  06/23/2005  Job:  [id number]   cc:   Jolyne Loa MD   Harl Bowie, M.D.  [address]  [place]  [place] [address]  Fax: [phone]

## 2011-03-17 NOTE — Op Note (Signed)
NAME:  [name] [name], [name] [name]                 ACCOUNT NO.:  [id number]   MEDICAL RECORD NO.:  [id number]          PATIENT TYPE:  INP   LOCATION:  2899                         FACILITY:  [facility]   PHYSICIAN:  Molly Maduro A. Thurston Hole, M.D. DATE OF BIRTH:  05/24/1934   DATE OF PROCEDURE:  07/30/2006  DATE OF DISCHARGE:                                 OPERATIVE REPORT   PREOPERATIVE DIAGNOSIS:  Right knee degenerative joint disease.   POSTOPERATIVE DIAGNOSIS:  Right knee degenerative joint disease.   PROCEDURE:  Right total knee replacement using DePuy cemented total knee  system with a #4 cemented femur, a #5 cemented tibia with 12.5-mm  polyethylene RP tibial spacer and 38-mm polyethylene cemented patella.   SURGEON:  Salvatore Marvel, M.D.   ASSISTANT:  Julien Girt, P.A.   ANESTHESIA:  General.   OPERATIVE TIME:  One hour 30 minutes.   COMPLICATIONS:  None.   DESCRIPTION OF PROCEDURE:  Mr. Ebron was brought to the operating room on  July 30, 2006 after a femoral nerve block was placed in holding room by  Anesthesia.  Placed on the operative table in supine position.  He received  Ancef 1 g IV preoperatively for prophylaxis.  After being placed under  general anesthesia, he had a Foley catheter placed under sterile conditions.  His right knee was examined, range of motion from -5 to 120 degrees with  moderate varus deformity, knee stable to ligamentous exam with normal  patellar tracking.  The right leg was prepped using sterile DuraPrep and  draped using sterile technique.  Leg was exsanguinated and a thigh  tourniquet elevated to 365 mm.  Initially, through a 15-cm longitudinal  incision based over the patella, initial exposure was made.  The underlying  subcutaneous tissues were incised in line with the skin incision.  A median  arthrotomy was performed, revealing an excessive amount of normal-appearing  joint fluid.  The articular surfaces were inspected.  He had grade 4 changes  medially, grade 3 changes laterally and grade 3 and 4 changes on the  patellofemoral joint.  Osteophytes were removed off the femoral condyles and  tibial plateau.  Medial and lateral meniscal remnants were removed as well  as the anterior cruciate ligament.  Intramedullary drill was then drilled up  the femoral canal for placement of the distal femoral cutting jig, which was  placed in the appropriate amount of rotation and the distal 12-mm cut was  made.  The distal femur was incised.  A #4 was found to be the appropriate size.  A #4 cutting jig was placed and  then these cuts were made.  The proximal tibia was then exposed.  The tibial  spines were removed with an oscillating saw.  Intramedullary drill was  drilled down the tibial canal for placement of proximal tibial cutting jig,  which was placed in the appropriate amount of rotation and a 10-mm cut was  made based off the lateral or higher side, correcting his varus deformity.  The cut tibial surface then had the #5 tibial baseplate trial tray placed,  this giving excellent  and parallel and stable fit.  Spacer blocks were then  placed.  A 12.5-mm block was placed in both flexion and extension; this gave  excellent balancing and excellent correction of his flexion and varus  deformities.  The #5 tibial baseplate trial was then placed on the cut  tibial surface on the keel cut was made.  The #4 PCL box cutter was placed  on the distal femur and then these cuts were made.  At this point, the #5  tibial baseplate trial was placed with a 12.5-mm polyethylene spacer and a  #4 femoral trial was placed, the knee reduced and taken through a range of  motion from 0-125 degrees with excellent stability and excellent correction  of his flexion and varus deformities.  The patella was incised.  A  resurfacing 9-mm cut was made and 3 locking holes placed for a 38-mm  polyethylene patella.  The trial was placed.  Patellofemoral tracking was   evaluated and this was found to be normal.  At this point, it felt that all  the trial components were of excellent size, fit and stability.  They were  then removed.  The knee was then jet-lavage-irrigated with 3 L of saline,  then the proximal tibia was exposed and a #5 tibial baseplate with cement  backing was hammered into position with an excellent fit, with excess cement  being removed from around the edges.  The #4 femoral component with cement  backing was hammered into position, also with an excellent fit, with excess  cement being removed from around the edges.  The 12.16mm polyethylene RP  tibial spacer was placed on the tibial baseplate and the knee reduced, taken  through range of motion from 0-125 degrees with excellent stability and  excellent correction of his flexion and varus deformities.  A 38-mm  polyethylene cement-backed patella was then placed in its position and held  there with a clamp.  After the cement hardened, patellofemoral tracking was  again evaluated and this was found to be normal.  At this point, it was felt  that all components were of excellent size, fit and stability.  The wound  was further irrigated with saline.  The tourniquet was released.  Hemostasis  was obtained with cautery.  The arthrotomy was then closed with #1 Ethibond  suture over 2 medium Hemovac drains.  Subcutaneous tissues were closed with  0 and 2-0 Vicryl, subcuticular layer closed with 4-0 Monocryl and sterile  dressings were applied.  The Hemovac was injected 0.2% Marcaine with  epinephrine and 4 mg of morphine and clamped.  The patient was then  awakened, extubated, and taken to the recovery room in stable condition.  Needle and sponge counts were correct x2 at the end of the case.      Robert A. Thurston Hole, M.D.  Electronically Signed     RAW/MEDQ  D:  07/30/2006  T:  07/31/2006  Job:  [id number]

## 2011-03-17 NOTE — H&P (Signed)
NAME:  [name] [name], SHOULTZ                 ACCOUNT NO.:  [id number]   MEDICAL RECORD NO.:  [id number]          PATIENT TYPE:  OIB   LOCATION:  2888                         FACILITY:  [facility]   PHYSICIAN:  Jonelle Sidle, M.D. LHCDATE OF BIRTH:  03-Apr-1934   DATE OF ADMISSION:  06/22/2005  DATE OF DISCHARGE:                                HISTORY & PHYSICAL   PRIMARY CARDIOLOGIST:  Harl Bowie, M.D.   REFERRING PHYSICIAN:  Doreen Beam, M.D.   REASON FOR ADMISSION:  Non-ST elevation myocardial infarction with ongoing  chest pain.   HISTORY OF PRESENT ILLNESS:  Mr. [name] [name] is a pleasant 77 year old male with a  reported history of hypertension and hyperlipidemia treated with lisinopril  and Crestor, respectively.  He has no reported history of antecedent  coronary artery disease or myocardial infarction.  He states that for  approximately the last 24 hours in a stuttering fashion, he has experienced  moderate aching epigastric to midsternal chest discomfort that began  suddenly at rest.  He has noted this to increase with ambulation since that  time and ultimately presented to the emergency department at [facility].  He was seen by cardiology at that facility and was noted to have  significant lateral ST segment depression in concert with an elevated  troponin I of 14 consistent with an acute coronary syndrome/non-ST elevation  myocardial infarction.  He was initially managed with aspirin, IV  nitroglycerin and Lovenox, and we were contacted via [facility] given the  patient's ongoing symptoms of chest pain.  Arrangements were made to  transport Mr. Leib urgently to our facility and he was also treated in the  interim with Integrilin.  He arrived urgently in our cardiac catheterization  lab and at that time was reporting no substantial chest pain.  He was  evaluated by myself and Dr. Samule Ohm with plans to proceed with urgent  coronary angiography given the situation.  Consent was obtained  and signed  on the chart.   ALLERGIES:  No reported drug allergies.   MEDICATIONS AT HOME:  1.  Crestor 40 mg p.o. daily.  2.  Lisinopril 20 mg p.o. daily.   PAST MEDICAL HISTORY:  1.  Hypertension.  2.  Hyperlipidemia.  3.  Osteoarthritis of the left knee.  4.  History of esophageal stricture.   SOCIAL HISTORY:  The patient lives in [place].  He denies any significant  tobacco or alcohol use history.   FAMILY HISTORY:  Significant for coronary artery disease in the patient's  sister at approximately 65 years of age.   REVIEW OF SYSTEMS:  As described in the history of present illness.  He has  occasional arthralgias predominantly involving his knees.  He has  gastroesophageal reflux disease symptoms.  He has had no bleeding problems,  fevers or chills.  Otherwise, systems are negative.   PHYSICAL EXAMINATION:  VITAL SIGNS:  Blood pressure 126/60, heart rate 60 in  sinus rhythm, oxygen saturation is 99% on 2 L nasal cannula.  GENERAL:  This is a well-nourished male denying active chest pain.  HEENT:  Conjunctivae  were normal.  NECK:  Supple without elevated jugular venous pressure or loud carotid  bruits.  No thyromegaly was noted.  CHEST:  Lungs are clear to auscultation anteriorly with nonlabored breathing  at rest.  CARDIAC:  A regular rate and rhythm with somewhat distant heart sounds, no  S3 gallop and no pericardial rub.  ABDOMEN:  Soft with no bruit, normoactive bowel sounds.  EXTREMITIES:  No pitting edema.  Peripheral pulses are 2+.   Laboratory data from [facility] includes a BNP of 215.  WBC is 15.8,  hemoglobin 14.9, platelets 226.  Sodium 139, potassium 4.6, chloride 103,  bicarb 28, BUN 11, creatinine 0.9.  CK 1284, CK-MB 173, troponin I 14.8.   Chest x-ray from [facility] is reported as showing no acute  abnormality.   INR level 1.0 from [facility]   A 12-lead electrocardiogram from today at 8:41 a.m. at [facility]  shows  lateral ST segment depression in the high lateral and anterolateral  leads with no significant ST elevation otherwise noted.   IMPRESSION:  1.  Non-ST elevation myocardial infarction with symptom onset approximately      24 hours ago.  Lateral ST segment depression is noted on EKG and      troponin I level is 14 initially with significant CK and CK-MB      elevations.  The patient reports no antecedent history of coronary      artery disease or myocardial infarction.  He is now on aspirin, Lovenox,      nitroglycerin and Integrilin and pain-free on arrival in the      catheterization lab.  2.  Hypertension.  3.  Hyperlipidemia, on Crestor at home.  4.  Reported history of esophageal stricture.  5.  Osteoarthritis of the left knee.   PLAN:  1.  The patient will proceed now with urgent diagnostic coronary angiography      with an eye toward revascularization if possible.  Dr. Samule Ohm has      discussed with the patient as myself and plans will be to proceed.  2.  Continue lisinopril.  Follow up fasting lipid profile and continue high-      dose statin therapy.  3.  Plavix has not been given as yet until anatomy is defined.  4.  Further plans to follow.           ______________________________  Jonelle Sidle, M.D. [facility]     SGM/MEDQ  D:  06/22/2005  T:  06/22/2005  Job:  [id number]   cc:   Harl Bowie, M.D.  [address]  [place]  [place] [address]  Fax: [phone]   Doreen Beam  [address]  [place]  [place] [address]  Fax: [phone]

## 2011-03-17 NOTE — Assessment & Plan Note (Signed)
[facility]                              CARDIOLOGY OFFICE NOTE   NAME:[name], [name]                          MRN:          [id number]  DATE:07/17/2006                            DOB:          July 11, 1934    REFERRED BY:  Molly Maduro A. Thurston Hole, M.D.   REASON FOR CONSULTATION:  Preoperative cardiac assessment.   HISTORY OF PRESENT ILLNESS:  [name] [name] is a 77 year old gentleman for whom  total knee replacement is planned on October 1. He suffered unstable angina  in August 2006. I placed a drug-eluting stent in the mid portion of the  circumflex. He has done very nicely since then. He is able to go up and down  a flight of stairs and mow his lawn without any dyspnea or chest discomfort.  He denies any orthopnea, PND, palpitations, syncope, presyncope, and  claudication.   CURRENT MEDICATIONS:  1. Aspirin 81 mg which he takes 4 times per day.  2. Fish oil 1000 mg per day.  3. Zantac 150 mg per day.  4. Lisinopril 20 mg per day.  5. Toprol XL 25 mg per day.  6. Crestor 40 mg per day.  7. Plavix 75 mg daily.   ALLERGIES:  NKDA.   PAST MEDICAL HISTORY:  1. Coronary artery disease status post non ST elevation myocardial      infarction August 2006.  2. Hypertension.  3. Hypercholesterolemia.  4. Esophageal stricture.  5. Osteoarthritis of both knees.  6. EF 50%.   SOCIAL HISTORY:  The patient is retired and married. He is accompanied by  his wife today. He does not smoke and drinks minimal alcohol.   FAMILY HISTORY:  Noncontributory.   REVIEW OF SYSTEMS:  Negative in detail except as above with the exception of  bilateral knee pain with standing and walking.   PHYSICAL EXAMINATION:  GENERAL:  He is generally well-appearing in no  distress.  VITAL SIGNS:  Heart rate 55, blood pressure 120/78, weight of 182 pounds. He  has no jugular venous distention and no thyromegaly.  LUNGS:  Clear to auscultation. Respiratory effort is normal.  HEART:  He  has a nondisplaced point of maximal cardiac impulse. There is a  regular rate and rhythm without murmur, rub or gallop.  ABDOMEN:  Soft, nondistended, nontender. There is no hepatosplenomegaly.  Bowel sounds are normal.  EXTREMITIES:  Without clubbing, cyanosis, edema or ulceration.  PULSES:  Carotid pulses are 2+ bilaterally bruit. Femoral pulses 2+  bilaterally without bruit.  HEENT:  Normal. There is no cervical, supraclavicular, or axillary  lymphadenopathy.  SKIN:  Normal.  MUSCULOSKELETAL:  Normal with the exception of crepitus with motion of both  knees.  NEUROLOGIC:  He is alert and oriented x3 with normal affect and normal  neurologic exam.   Electrocardiogram demonstrates sinus bradycardia and is otherwise normal.   IMPRESSION/RECOMMENDATIONS:  [name] [name] has good exercise tolerance. He has  done very nicely after his non ST elevation myocardial infarction treated  with drug-eluting stent. With this good exercise tolerance, he is at low  risk for perioperative  cardiac event. No further risk stratification is  necessary. He should be maintained on his Toprol XL including taking it the  morning of the procedure. He should also be maintained on his Crestor. Would  continue the aspirin at 81 mg per day . His Plavix will be discontinued 7  days prior to the procedure and resumed as soon as safe thereafter.   [name] [name] does have a drug-eluting stent in his circumflex artery. I have  discussed with him the small but real risk of stent thrombosis resulting in  myocardial infarction with surgery and a temporary cessation of his Plavix.                                 [name] Farber, MD    WED/MedQ  DD:  07/17/2006  DT:  07/18/2006  Job #:  [id number]   cc:   Molly Maduro A. Thurston Hole, M.D.

## 2011-07-06 ENCOUNTER — Emergency Department (HOSPITAL_COMMUNITY): Payer: Medicare Other

## 2011-07-06 ENCOUNTER — Inpatient Hospital Stay (HOSPITAL_COMMUNITY)
Admission: EM | Admit: 2011-07-06 | Discharge: 2011-07-08 | DRG: 312 | Disposition: A | Discharge status: Home / Self Care | Payer: Medicare Other | Attending: Internal Medicine | Admitting: Internal Medicine

## 2011-07-06 DIAGNOSIS — I658 Occlusion and stenosis of other precerebral arteries: Secondary | ICD-10-CM | POA: Diagnosis present

## 2011-07-06 DIAGNOSIS — R197 Diarrhea, unspecified: Secondary | ICD-10-CM | POA: Diagnosis present

## 2011-07-06 DIAGNOSIS — Z7982 Long term (current) use of aspirin: Secondary | ICD-10-CM

## 2011-07-06 DIAGNOSIS — I6529 Occlusion and stenosis of unspecified carotid artery: Secondary | ICD-10-CM | POA: Diagnosis present

## 2011-07-06 DIAGNOSIS — I2510 Atherosclerotic heart disease of native coronary artery without angina pectoris: Secondary | ICD-10-CM | POA: Diagnosis present

## 2011-07-06 DIAGNOSIS — R51 Headache: Secondary | ICD-10-CM | POA: Diagnosis present

## 2011-07-06 DIAGNOSIS — R0989 Other specified symptoms and signs involving the circulatory and respiratory systems: Secondary | ICD-10-CM

## 2011-07-06 DIAGNOSIS — I252 Old myocardial infarction: Secondary | ICD-10-CM

## 2011-07-06 DIAGNOSIS — E7800 Pure hypercholesterolemia, unspecified: Secondary | ICD-10-CM | POA: Diagnosis present

## 2011-07-06 DIAGNOSIS — R55 Syncope and collapse: Principal | ICD-10-CM | POA: Diagnosis present

## 2011-07-06 DIAGNOSIS — I498 Other specified cardiac arrhythmias: Secondary | ICD-10-CM

## 2011-07-06 DIAGNOSIS — I10 Essential (primary) hypertension: Secondary | ICD-10-CM | POA: Diagnosis present

## 2011-07-06 DIAGNOSIS — Z96659 Presence of unspecified artificial knee joint: Secondary | ICD-10-CM

## 2011-07-06 LAB — PRO B NATRIURETIC PEPTIDE: Pro B Natriuretic peptide (BNP): 133.7 pg/mL (ref 0–450)

## 2011-07-06 LAB — BASIC METABOLIC PANEL
BUN: 13 mg/dL (ref 6–23)
Calcium: 9.3 mg/dL (ref 8.4–10.5)
GFR calc Af Amer: >60 mL/min (ref >60)
GFR calc non Af Amer: >60 mL/min (ref >60)
Glucose, Bld: 112 mg/dL — ABNORMAL HIGH (ref 70–99)
Potassium: 4.3 mEq/L (ref 3.5–5.1)
Sodium: 142 mEq/L (ref 135–145)

## 2011-07-06 LAB — DIFFERENTIAL
Basophils Absolute: 0 10*3/uL (ref 0.0–0.1)
Basophils Relative: 0 % (ref 0–1)
Eosinophils Relative: 1 % (ref 0–5)
Monocytes Absolute: 0.5 10*3/uL (ref 0.1–1.0)
Monocytes Relative: 6 % (ref 3–12)

## 2011-07-06 LAB — CBC
HCT: 38.3 % — ABNORMAL LOW (ref 39.0–52.0)
MCH: 32.1 pg (ref 26.0–34.0)
MCHC: 35.5 g/dL (ref 30.0–36.0)
RDW: 12.7 % (ref 11.5–15.5)

## 2011-07-06 LAB — CARDIAC PANEL(CRET KIN+CKTOT+MB+TROPI)
CK, MB: 2.8 ng/mL (ref 0.3–4.0)
Relative Index: INVALID (ref 0.0–2.5)
Total CK: 89 U/L (ref 7–232)
Troponin I: <0.3 ng/mL (ref <0.30)

## 2011-07-06 LAB — URINALYSIS, ROUTINE W REFLEX MICROSCOPIC
Bilirubin Urine: NEGATIVE
Nitrite: NEGATIVE
Protein, ur: NEGATIVE mg/dL
Specific Gravity, Urine: 1.021 (ref 1.005–1.030)
Urobilinogen, UA: 0.2 mg/dL (ref 0.0–1.0)

## 2011-07-06 LAB — POCT I-STAT TROPONIN I: Troponin i, poc: 0.01 ng/mL (ref 0.00–0.08)

## 2011-07-06 LAB — URINE MICROSCOPIC-ADD ON

## 2011-07-07 ENCOUNTER — Inpatient Hospital Stay (HOSPITAL_COMMUNITY): Payer: Medicare Other

## 2011-07-07 DIAGNOSIS — I517 Cardiomegaly: Secondary | ICD-10-CM

## 2011-07-07 LAB — DIFFERENTIAL
Eosinophils Absolute: 0.2 10*3/uL (ref 0.0–0.7)
Eosinophils Relative: 3 % (ref 0–5)
Lymphocytes Relative: 38 % (ref 12–46)
Lymphs Abs: 3.2 10*3/uL (ref 0.7–4.0)
Monocytes Absolute: 0.8 10*3/uL (ref 0.1–1.0)
Monocytes Relative: 9 % (ref 3–12)

## 2011-07-07 LAB — LIPID PANEL
Cholesterol: 147 mg/dL (ref 0–200)
LDL Cholesterol: 63 mg/dL (ref 0–99)
Total CHOL/HDL Ratio: 5.7 RATIO
Triglycerides: 290 mg/dL — ABNORMAL HIGH (ref <150)
VLDL: 58 mg/dL — ABNORMAL HIGH (ref 0–40)

## 2011-07-07 LAB — APTT: aPTT: 31 seconds (ref 24–37)

## 2011-07-07 LAB — CBC
HCT: 37.2 % — ABNORMAL LOW (ref 39.0–52.0)
MCH: 32 pg (ref 26.0–34.0)
MCV: 91 fL (ref 78.0–100.0)
Platelets: 198 10*3/uL (ref 150–400)
RDW: 13 % (ref 11.5–15.5)
WBC: 8.4 10*3/uL (ref 4.0–10.5)

## 2011-07-07 LAB — COMPREHENSIVE METABOLIC PANEL
AST: 15 U/L (ref 0–37)
Albumin: 3.5 g/dL (ref 3.5–5.2)
Alkaline Phosphatase: 66 U/L (ref 39–117)
BUN: 16 mg/dL (ref 6–23)
Creatinine, Ser: 0.86 mg/dL (ref 0.50–1.35)
Potassium: 3.6 mEq/L (ref 3.5–5.1)
Total Protein: 6.4 g/dL (ref 6.0–8.3)

## 2011-07-07 LAB — CARDIAC PANEL(CRET KIN+CKTOT+MB+TROPI)
CK, MB: 2.5 ng/mL (ref 0.3–4.0)
Relative Index: INVALID (ref 0.0–2.5)
Relative Index: INVALID (ref 0.0–2.5)
Total CK: 83 U/L (ref 7–232)
Troponin I: <0.3 ng/mL (ref <0.30)

## 2011-07-07 LAB — URINE CULTURE

## 2011-07-07 LAB — HEMOGLOBIN A1C: Hgb A1c MFr Bld: 6.3 % — ABNORMAL HIGH (ref <5.7)

## 2011-07-07 MED ORDER — GADOBENATE DIMEGLUMINE 529 MG/ML IV SOLN
20.0000 mL | Freq: Once | INTRAVENOUS | Status: AC
  Administered 2011-07-07: 20 mL via INTRAVENOUS

## 2011-07-09 NOTE — H&P (Signed)
NAMEBROGAN, [name] [name]                 ACCOUNT NO.:  [id number]  MEDICAL RECORD NO.:  [id number]  LOCATION:  [facility]                         FACILITY:  [facility]  PHYSICIAN:  Kathlen Mody, MD       DATE OF BIRTH:  21-Jul-1934  DATE OF ADMISSION:  07/06/2011 DATE OF DISCHARGE:                             HISTORY & PHYSICAL   PRIMARY CARE PHYSICIAN:  Robert A. Thurston Hole, MD  CHIEF COMPLAINT:  Lightheadedness, nausea, diaphoretic this morning.  HISTORY OF PRESENT ILLNESS:  This is a 77 year old gentleman with history of hypertension, hyperlipidemia, history of non-ST elevation MI with a drug-eluting stent to circumflex in 2006, got up this morning and had a shower and was trying to put his pants on, he bent down to put his pants on and got up that is when he felt lightheaded associated with a pounding headache.  The patient walked to a recliner, laid down for a few minutes, and when he tried to get up he felt nauseous, diaphoretic, went pale, and felt like he was about to pass out.  The patient's wife regarded his blood pressure which was over 200s/90s following which he had one episode of vomiting.  The patient denies any shortness of breath, chest pain, fever, or chills.  He denies any abdominal pain, diarrhea, or any urinary complaints.  EMS was called and found that he was bradycardic in 40s-50s, gave him one dose of atropine and brought him to [facility] ER.  While in the ER, his heart rate was between 60s- 70s.  His symptoms have resolved.  The patient also denies any tingling or numbness in his extremities.  Denies any headache or blurry vision at this time.  Denies any focal weakness.  REVIEW OF SYSTEMS:  See HPI, otherwise negative.  PAST MEDICAL HISTORY: 1. History of hypertension. 2. Hyperlipidemia. 3. History of non-ST elevation MI in 2006 with PCI to circumflex. 4. Recent history of esophageal stricture. 5. GERD. 6. History of orthostatic hypotension. 7. BPH.  HOME  MEDICATIONS: 1. Aspirin 81 mg. 2. Crestor. 3. Prilosec. 4. Losartan. 5. Hydrochlorothiazide. 6. Metoprolol.  FAMILY HISTORY:  History of a symptomatic bradycardia in her mother with history of pacemaker placement.  SOCIAL HISTORY:  The patient is married, retired.  Denies smoking, takes occasional EtOH.  Denies any recreational drug use.  ALLERGIES:  No known drug allergies.  PERTINENT LABORATORY DATA:  CBC significant for hematocrit of 38.3. Basic metabolic panel significant for a glucose of 112.  Urinalysis negative for nitrites and leukocytes, trace blood.  Point-of-care troponin was negative.  DIAGNOSTIC STUDIES:  Two-view chest x-ray shows low lung volumes with no acute findings.  CT head without contrast showed no evidence of acute intracranial abnormality.  There are chronic ischemic changes.  EKG:  Normal sinus rhythm at 61.  ASSESSMENT AND PLAN: 1. This is a 77-year on gentleman admitted for lightheadedness,     headache, nausea, diaphoretic, and one episode of vomiting.  The     patient is being admitted to the Hospitalist Service for evaluation     and management of near syncope/symptomatic bradycardia.  He will be     admitted to Telemetry.  We will get 2 sets of cardiac enzymes q.6     h. x3.  We will get a 2-D echocardiogram with contrast.  We will     hold the beta-blockers for now and get one set of orthostatics and     we will call cardiology consult from [facility].  We will     continue with aspirin and Crestor at this time. 2. Gastroesophageal reflux disease.  We will continue with his     Prilosec. 3. Deep venous thrombosis prophylaxis.  Subcutaneous Lovenox as per     the Pharmacy. 4. Further management as per the patient's clinical course. 5. The patient is full code.          ______________________________ Kathlen Mody, MD     VA/MEDQ  D:  07/06/2011  T:  07/06/2011  Job:  [id number]  Electronically Signed by Kathlen Mody MD on  07/09/2011 09:08:25 PM

## 2011-07-28 NOTE — Discharge Summary (Signed)
  [name] [name], [name] [name]                 ACCOUNT NO.:  [id number]  MEDICAL RECORD NO.:  [id number]  LOCATION:  2016                         FACILITY:  [facility]  PHYSICIAN:  Richarda Overlie, MD       DATE OF BIRTH:  1934/05/21  DATE OF ADMISSION:  07/06/2011 DATE OF DISCHARGE:  07/08/2011                              DISCHARGE SUMMARY   PRIMARY CARE PHYSICIAN:  Molly Maduro A. Thurston Hole, MD  ADDITIONAL DISCHARGE DIAGNOSIS:  Self-limiting diarrhea, ruled out for Clostridium difficile.  DISCHARGE MEDICATIONS: 1. Lasix 20 mg p.o. daily. 2. Losartan 25 mg p.o. daily. 3. Lovaza 4 g p.o. daily. 4. Aspirin 81 mg p.o. daily. 5. Sinemet over-the-counter 1 tablet p.o. daily. 6. Crestor 40 mg p.o. daily.  Kindly see discharge summary from July 07, 2011 for details of the patient's current hospitalization.  ADDITIONAL DIAGNOSTIC WORKUPS:  A 2-D echo shows systolic function is normal with an EF of 60%-65%.  The cavity is normal in size.  Mild LVH is seen.  MRI of the head shows no acute extensive chronic small vessel changes throughout the brain.  Normal intracranial MR angiography of the large and the medium-sized vessels.  Negative MRI angiography of the neck vessels.  Mild atherosclerotic irregularity in the carotid siphon. Carotid Doppler shows 40%-59% stenosis in bilateral carotid arteries.  PHYSICAL EXAMINATION:  VITAL SIGNS:  Blood pressure 143/62, respirations 19, pulse 55, temperature 97.2, 95% on room air. GENERAL:  Comfortable currently.  No acute cardiopulmonary distress. HEENT:  Pupils equal and reactive.  Extraocular movements intact. NECK:  Supple.  No JVD. LUNGS:  Clear to auscultation bilaterally.  No wheezes, crackles, or rhonchi. CARDIOVASCULAR:  Regular rate and rhythm.  No murmurs, rubs, or gallops. ABDOMEN:  Soft, nontender, and nondistended. EXTREMITIES:  Without cyanosis, clubbing, or edema.  INTERIM HOSPITAL COURSE:  Discharge was held yesterday because of diarrhea after  lunch.  The patient's diarrhea was self-limiting and resolved fairly quickly without any intervention as his C. Diff PCR was negative.  The patient appears to be hemodynamically stable today and would like to go home.  FOLLOWUP RECOMMENDATIONS:  He would need followup with PCP in 5-7 days. He would need lipid panel in 1 month.  He would need a BMP panel to check his potassium in 1 month.  He is a full code.     Richarda Overlie, MD     NA/MEDQ  D:  07/08/2011  T:  07/08/2011  Job:  [id number]  cc:   Molly Maduro A. Thurston Hole, M.D.  Electronically Signed by Richarda Overlie MD on 07/28/2011 07:13:19 AM

## 2011-07-28 NOTE — Discharge Summary (Signed)
NAMEHAYES, CZAJA                 ACCOUNT NO.:  [id number]  MEDICAL RECORD NO.:  [id number]  LOCATION:  2016                         FACILITY:  [facility]  PHYSICIAN:  Richarda Overlie, MD       DATE OF BIRTH:  [birth year]  DATE OF ADMISSION:  07/06/2011 DATE OF DISCHARGE:  07/07/2011                        DISCHARGE SUMMARY - REFERRING   PRIMARY CARE PHYSICIAN:  Molly Maduro A. Thurston Hole, MD  PRIMARY CARDIOLOGIST:  Norman Herrlich, MD  DISCHARGE DIAGNOSES: 1. Bradycardia, likely secondary to vasovagal episode. 2. Headache, nausea, vomiting, and diaphoresis; likely secondary to     bradycardia. 3. Coronary artery disease with history of non-ST elevation myocardial     infarction in 2006, with a Taxus drug-eluting stent. 4. Hypertension. 5. Hypertriglyceridemia. 6. History of esophageal stricture dilatation. 7. Degenerative joint disease.  DISCHARGE MEDICATIONS: 1. Fish oil 1000 mg p.o. twice daily. 2. Lasix 20 mg p.o. daily. 3. Losartan 25 mg 1 tablet p.o. daily. 4. Aspirin 81 mg p.o. daily. 5. Cinnamon 1 tablet p.o. daily. 6. Crestor 40 mg p.o. daily.  CONSULTATIONS:  Marca Ancona, MD for bradycardia.  SUBJECTIVE:  This is a 77 year old male with a history of coronary artery disease in his usual state of health who developed severe nausea, vomiting, diaphoresis, and bilateral headache without any unilateral weakness, slurred speech, or visual disturbances.  The patient was found to have a heart rate in the 40s.  The patient was found to have sinus bradycardia on telemetry with normal blood pressures.  Upon presentation to the ED, his blood pressure was actually elevated greater than 200, but his heart rate was in the 40s.  Cardiology was consulted because of his bradycardia and his history of coronary artery disease with a Taxus drug-eluting stent.  Cardiac enzymes were cycled and was found to be negative.  His Toprol-XL was discontinued and the patient was found to have heart rate in  the 60s and 70s prior to his discharge today.  He denied any lightheadedness.  He was initially treated with hypertension, but subsequently his losartan and hydrochlorothiazide were held because his blood pressure was found to be in the 120 systolic.  He was initiated on Lasix because clinically, he was found to have some volume overload and he has been initiated on a low-dose Lasix.  His BNP was 110.  A 2-D echo has been ordered and is pending.  Because of his severe bilateral headache, a CT of the head without contrast was done that was found to be negative without any intracranial abnormality and it only showed chronic ischemic changes.  An MRI/MRA of the brain has also been ordered and this is pending at this time.  DISPOSITION:  The patient currently is completely asymptomatic.  His bradycardia has resolved with holding the Toprol-XL.  We have adjusted his antihypertensive medication and he has been provided with a prescription for losartan and Lasix and therefore, his hydrochlorothiazide is being discontinued.  He will continue on a low- dose aspirin.  I have discussed with him his hypertriglyceridemia.  He has tried niacin in the past and this had to be discontinued because of flushing and pruritus.  I have recommended that the patient start  fish oil 1000 mg p.o. twice a day and have his lipid panel rechecked in about 6-8 weeks.  The patient has had a recent stress test in January 2012, which was within normal limits.  Therefore, if Cardiology is okay with discharging the patient today, then the patient will be discharged today.  FOLLOWUP INSTRUCTIONS:  Follow up with PCP in 5-7 days.  Follow up with Cardiology in 2-3 weeks.  Have a lipid panel checked in 6-8 weeks.     Richarda Overlie, MD     NA/MEDQ  D:  07/07/2011  T:  07/07/2011  Job:  [id number]  cc:   Norman Herrlich, MD  Electronically Signed by Richarda Overlie MD on 07/28/2011 07:12:59 AM

## 2011-07-31 DIAGNOSIS — I10 Essential (primary) hypertension: Secondary | ICD-10-CM | POA: Insufficient documentation

## 2011-07-31 DIAGNOSIS — E785 Hyperlipidemia, unspecified: Secondary | ICD-10-CM

## 2011-07-31 DIAGNOSIS — G4733 Obstructive sleep apnea (adult) (pediatric): Secondary | ICD-10-CM

## 2011-07-31 HISTORY — DX: Essential (primary) hypertension: I10

## 2011-07-31 HISTORY — DX: Hyperlipidemia, unspecified: E78.5

## 2011-07-31 HISTORY — DX: Obstructive sleep apnea (adult) (pediatric): G47.33

## 2011-08-03 DIAGNOSIS — G20 Parkinson's disease: Secondary | ICD-10-CM

## 2011-08-03 HISTORY — DX: Parkinson's disease: G20

## 2011-08-14 LAB — URINE MICROSCOPIC-ADD ON

## 2011-08-14 LAB — DIFFERENTIAL
Basophils Relative: 1
Eosinophils Absolute: 0.4
Eosinophils Relative: 5
Monocytes Absolute: 0.7
Monocytes Relative: 9

## 2011-08-14 LAB — COMPREHENSIVE METABOLIC PANEL
ALT: 18
Albumin: 3.9
Alkaline Phosphatase: 62
Glucose, Bld: 109 — ABNORMAL HIGH
Potassium: 3.9
Sodium: 138
Total Protein: 6.7

## 2011-08-14 LAB — CBC
HCT: 31.9 — ABNORMAL LOW
HCT: 34.5 — ABNORMAL LOW
Hemoglobin: 11.1 — ABNORMAL LOW
Hemoglobin: 14
MCHC: 34.3
MCHC: 34.6
MCV: 92.1
MCV: 93.8
Platelets: 160
Platelets: 243
RBC: 3.68 — ABNORMAL LOW
RDW: 13.1
RDW: 13.2

## 2011-08-14 LAB — BASIC METABOLIC PANEL
BUN: 11
CO2: 27
CO2: 27
Chloride: 105
Chloride: 107
Creatinine, Ser: 1.01
GFR calc Af Amer: >60
GFR calc non Af Amer: >60
Glucose, Bld: 117 — ABNORMAL HIGH
Potassium: 4
Potassium: 4.6

## 2011-08-14 LAB — TYPE AND SCREEN: Antibody Screen: NEGATIVE

## 2011-08-14 LAB — URINALYSIS, ROUTINE W REFLEX MICROSCOPIC
Bilirubin Urine: NEGATIVE
Nitrite: NEGATIVE
Specific Gravity, Urine: 1.026
pH: 5.5

## 2011-08-14 LAB — PROTIME-INR: Prothrombin Time: 14.6

## 2011-08-14 LAB — URINE CULTURE

## 2011-08-23 NOTE — Consult Note (Signed)
[name] [name], VARKEY NO.:  [id number]  MEDICAL RECORD NO.:  [id number]  LOCATION:  2016                         FACILITY:  [facility]  PHYSICIAN:  Marca Ancona, MD      DATE OF BIRTH:  [birth year]  DATE OF CONSULTATION:  07/06/2011 DATE OF DISCHARGE:                                CONSULTATION   PRIMARY CARE PHYSICIAN:  Feliciana Rossetti, MD  PRIMARY CARDIOLOGIST:  Salvadore Farber, MD until 2008, then Denzil Magnuson who retired and now Norman Herrlich, MD  CHIEF COMPLAINT:  Bradycardia.  HISTORY OF PRESENT ILLNESS:  [name] [name] is a 77 year old male with a history of coronary artery disease.  He was in his usual state of health this a.m. and had sudden onset of a severe bilateral headache.  This was associated with nausea, vomiting, and diaphoresis.  He denies chest pain.  He denies presyncope or syncope.  He had no visual changes or weakness.  He had no awareness of a decreased heart rate, although his heart rate in the emergency room has been in the 40s at times.  In the emergency room, his headache resolved without medical intervention noted.  On telemetry in EKG, he has sinus bradycardia occasionally into the 40s, but no significant pauses, all sinus rhythm and no sustained heart rates less than 45.  Currently, he is asymptomatic.  PAST MEDICAL HISTORY: 1. Status post stress test by Dr. Dulce Sellar in 2012, okay per the     patient. 2. Status post non-ST-segment elevation MI in 2006, with cardiac     catheterization showing LAD 50% and 60%, D1 is 90%, ramus     intermedius patent, RCA 40% and 50%, circumflex totalled that was     treated with 2.75 x 20-mm Taxus drug-eluting stent.  PDA was 99%     and EF at that time was 50%. 3. Hypertension. 4. Hyperlipidemia. 5. Family history of coronary artery disease. 6. History of esophageal stricture and dilatation. 7. Degenerative joint disease.  PAST SURGICAL HISTORY:  He is status post cardiac catheterization as well as  bilateral total knee replacements and EGD with dilatation.  ALLERGIES:  No known drug allergies.  Medications prior to admission include: 1. Losartan/hydrochlorothiazide 100/12.5 daily. 2. Aspirin 81 mg a day. 3. Toprol-XL 25 mg a day. 4. Crestor 40 mg a day. 5. Cinnamon daily.  SOCIAL HISTORY:  He lives in [place] with his wife.  He is retired.  He has no history of alcohol, tobacco, or drug abuse.  He has not noted any change in his ability to exert himself recently.  He feels he eats a pretty good diet.  FAMILY HISTORY:  Both his parents are deceased and his sister does have a history of coronary artery disease.  REVIEW OF SYSTEMS:  He has hearing loss.  He has chronic arthralgias and joint pains.  He denies melena or recent reflux symptoms.  He has not had lower extremity edema.  He never gets palpitations.  He denies coughing and wheezing.  He denies dyspnea on exertion, but his respiratory rate accelerates with conversation.  Full 14-point review of systems is otherwise negative except as stated in the HPI.  PHYSICAL EXAMINATION:  VITAL SIGNS:  Temperature is 96.7, blood pressure 123/54, heart rate 69, respiratory rate 16, O2 saturation 97% on room air. GENERAL:  He is a well-developed elderly white male in no acute distress. HEENT:  Normal. NECK:  There is no lymphadenopathy, thyromegaly, or bruits noted, but he has JVD at 8-9 cm. CV:  His heart is regular in rate and rhythm with an S1 and S2 and no significant murmur, rub, or gallop is noted.  Distal pulses are intact in all 4 extremities, but he has a slightly decreased right DP. LUNGS:  He has rales bilaterally. SKIN:  No rashes or lesions are noted. ABDOMEN:  Soft and nontender with active bowel sounds. EXTREMITIES:  There is no cyanosis, clubbing, or edema noted. MUSCULOSKELETAL:  He has no joint deformity or effusions and no spine or CVA tenderness noted. NEUROLOGIC:  He is alert and oriented.  Cranial nerves  II through XII grossly intact.  CT of the head showed no acute disease.  Chest x-ray showed decreased lung volumes with no acute disease.  EKG; sinus rhythm, rate 61 with no acute changes.  LABORATORY VALUES:  Sodium 142, potassium 4.3, chloride 104, CO2 of 31, BUN 13, creatinine 0.81, glucose 112.  Hemoglobin 13.6, hematocrit 38.3, WBCs 8.1, platelets 211.  Urinalysis negative.  Point-of-care troponin negative x1.  BNP pending.  IMPRESSION:  [name] [name] was seen today by Dr. Shirlee Latch, the patient evaluated and the data reviewed.  He presented with a severe headache associated with nausea and diaphoresis.  He had mild bradycardia with heart rate in the 40s-60s on telemetry.  He had some lightheadedness this a.m. associated with the headache.  PLAN: 1. Mild bradycardia:  We will hold metoprolol for now and follow him     on telemetry. 2. Volume overload:  He has mild volume overload on examination with     some dyspnea on exertion.  We will follow up on the echocardiogram     which has been ordered and given one dose of IV Lasix with 20 mg     p.o. daily to follow up.     Theodore Demark, PA-C   ______________________________ Marca Ancona, MD    RB/MEDQ  D:  07/06/2011  T:  07/06/2011  Job:  [id number]  Electronically Signed by Theodore Demark PA-C on 08/07/2011 06:32:18 AM Electronically Signed by Marca Ancona MD on 08/23/2011 08:02:28 PM

## 2012-08-26 IMAGING — CR DG CHEST 2V
2 series · 2 of 2 positions shown · non-contrast
Comparison: 07/30/2006

CLINICAL DATA: Vomiting.  Weakness.  Headache.  Coronary artery
disease.

CHEST - 2 VIEW

[w chest lat]
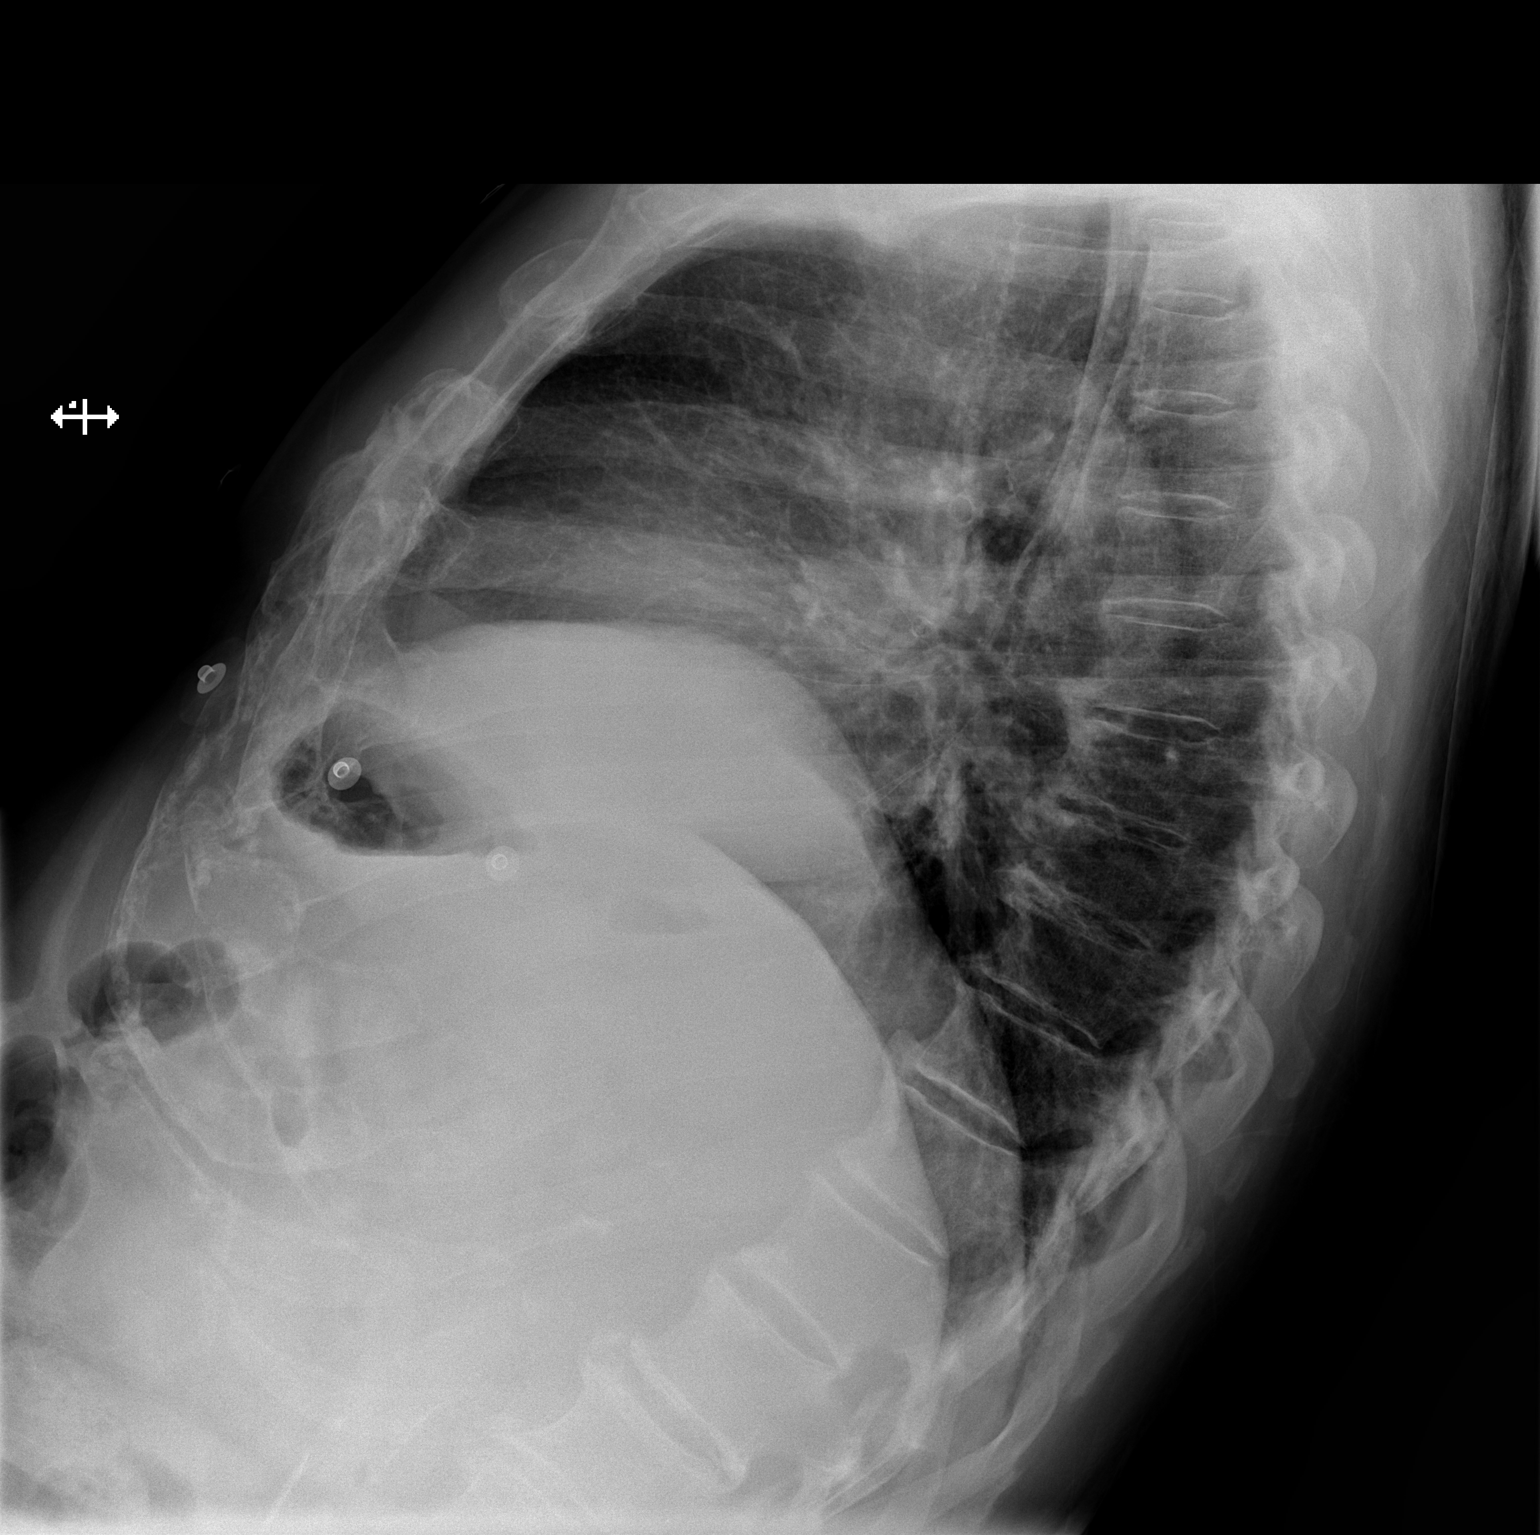

[x chest ap]
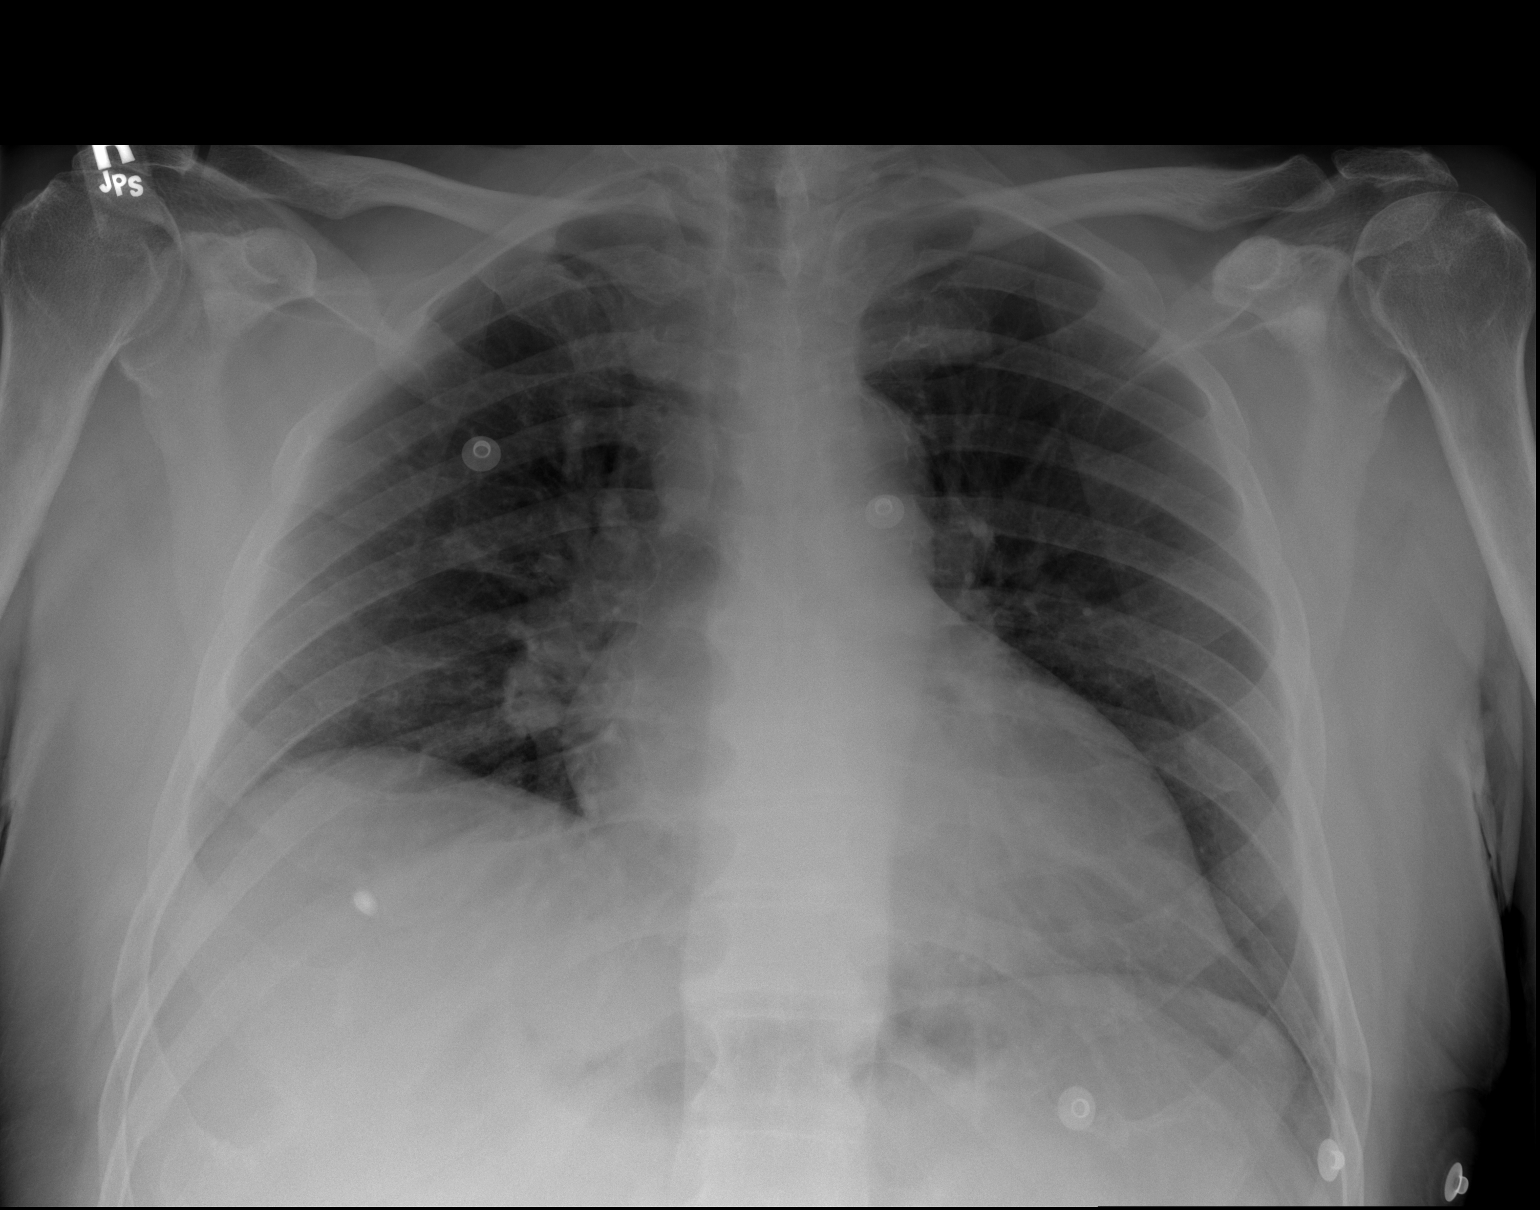

[2 of 2 positions shown; findings below may reference images not displayed]

FINDINGS: Low lung volumes are seen.  Both lungs are clear.  Heart
size is within normal limits.  No evidence of pleural effusion.  No
mass or lymphadenopathy identified.
IMPRESSION: Low lung volumes.  No acute findings.

## 2016-01-31 DIAGNOSIS — I2510 Atherosclerotic heart disease of native coronary artery without angina pectoris: Secondary | ICD-10-CM

## 2016-01-31 DIAGNOSIS — E785 Hyperlipidemia, unspecified: Secondary | ICD-10-CM | POA: Insufficient documentation

## 2016-01-31 DIAGNOSIS — I119 Hypertensive heart disease without heart failure: Secondary | ICD-10-CM

## 2016-01-31 DIAGNOSIS — R001 Bradycardia, unspecified: Secondary | ICD-10-CM

## 2016-01-31 DIAGNOSIS — I25119 Atherosclerotic heart disease of native coronary artery with unspecified angina pectoris: Secondary | ICD-10-CM | POA: Insufficient documentation

## 2016-01-31 HISTORY — DX: Atherosclerotic heart disease of native coronary artery without angina pectoris: I25.10

## 2016-01-31 HISTORY — DX: Bradycardia, unspecified: R00.1

## 2016-01-31 HISTORY — DX: Hypertensive heart disease without heart failure: I11.9

## 2016-01-31 HISTORY — DX: Hyperlipidemia, unspecified: E78.5

## 2016-03-06 DIAGNOSIS — M159 Polyosteoarthritis, unspecified: Secondary | ICD-10-CM

## 2016-03-06 DIAGNOSIS — K208 Other esophagitis without bleeding: Secondary | ICD-10-CM

## 2016-03-06 DIAGNOSIS — Z79899 Other long term (current) drug therapy: Secondary | ICD-10-CM

## 2016-03-06 DIAGNOSIS — N400 Enlarged prostate without lower urinary tract symptoms: Secondary | ICD-10-CM

## 2016-03-06 HISTORY — DX: Benign prostatic hyperplasia without lower urinary tract symptoms: N40.0

## 2016-03-06 HISTORY — DX: Other esophagitis without bleeding: K20.80

## 2016-03-06 HISTORY — DX: Other long term (current) drug therapy: Z79.899

## 2016-03-06 HISTORY — DX: Polyosteoarthritis, unspecified: M15.9

## 2016-03-09 DIAGNOSIS — R7303 Prediabetes: Secondary | ICD-10-CM

## 2016-03-09 HISTORY — DX: Prediabetes: R73.03

## 2016-04-15 DIAGNOSIS — R4182 Altered mental status, unspecified: Secondary | ICD-10-CM | POA: Insufficient documentation

## 2016-04-15 DIAGNOSIS — A419 Sepsis, unspecified organism: Secondary | ICD-10-CM

## 2016-04-15 DIAGNOSIS — N390 Urinary tract infection, site not specified: Secondary | ICD-10-CM

## 2016-04-15 DIAGNOSIS — D72825 Bandemia: Secondary | ICD-10-CM

## 2016-04-15 HISTORY — DX: Urinary tract infection, site not specified: N39.0

## 2016-04-15 HISTORY — DX: Bandemia: D72.825

## 2016-04-15 HISTORY — DX: Altered mental status, unspecified: R41.82

## 2016-04-15 HISTORY — DX: Sepsis, unspecified organism: A41.9

## 2017-05-14 ENCOUNTER — Other Ambulatory Visit: Payer: Self-pay | Admitting: Cardiology

## 2017-07-17 DIAGNOSIS — D638 Anemia in other chronic diseases classified elsewhere: Secondary | ICD-10-CM | POA: Insufficient documentation

## 2017-07-17 HISTORY — DX: Anemia in other chronic diseases classified elsewhere: D63.8

## 2017-12-24 DIAGNOSIS — R5381 Other malaise: Secondary | ICD-10-CM | POA: Insufficient documentation

## 2017-12-24 DIAGNOSIS — R5383 Other fatigue: Secondary | ICD-10-CM

## 2017-12-24 DIAGNOSIS — Z9181 History of falling: Secondary | ICD-10-CM | POA: Insufficient documentation

## 2017-12-24 HISTORY — DX: Other malaise: R53.81

## 2017-12-24 HISTORY — DX: Other fatigue: R53.83

## 2017-12-24 HISTORY — DX: History of falling: Z91.81

## 2018-03-08 DIAGNOSIS — N319 Neuromuscular dysfunction of bladder, unspecified: Secondary | ICD-10-CM

## 2018-03-08 HISTORY — DX: Neuromuscular dysfunction of bladder, unspecified: N31.9

## 2018-06-11 DIAGNOSIS — G9340 Encephalopathy, unspecified: Secondary | ICD-10-CM

## 2018-06-11 DIAGNOSIS — N3001 Acute cystitis with hematuria: Secondary | ICD-10-CM

## 2018-06-11 DIAGNOSIS — R339 Retention of urine, unspecified: Secondary | ICD-10-CM

## 2018-06-11 HISTORY — DX: Retention of urine, unspecified: R33.9

## 2018-06-11 HISTORY — DX: Encephalopathy, unspecified: G93.40

## 2018-06-11 HISTORY — DX: Acute cystitis with hematuria: N30.01

## 2018-06-17 NOTE — Progress Notes (Signed)
Cardiology Office Note:    Date:  06/18/2018   ID:  [name] [name], DOB [birth year], MRN [id number]  PCP:  Gordan PaymentGrisso, Greg A., MD  Cardiologist:  Norman HerrlichBrian Liane Tribbey, MD    Referring MD: No ref. provider found    ASSESSMENT:    1. Atherosclerosis of native coronary artery of native heart without angina pectoris   2. Coronary artery disease involving native coronary artery of native heart with angina pectoris (HCC)   3. Hypertensive heart disease without heart failure   4. Sinus bradycardia   5. Dyslipidemia    PLAN:    In order of problems listed above:  1. Stable exam no anginal discomfort he will continue medical treatment including low-dose aspirin he is not on a beta-blocker because of bradycardia and I asked him resume a minimum dose of a statin for class effect.  With his comorbidities and his stable EKG and absence of angina I would not pursue an ischemia evaluation is a very poor candidate for elective cardiac revascularization 2. See see above 3. Worsened his blood pressure is relatively low standing 110/50 he is had episodes of falls at home I asked him to stop his ARB.  These individuals with Parkinson's are at high risk of autonomic instability and symptomatic hypotension 4. Stable on today's EKG avoid beta-blocker 5. He had stopped his statin due to concerns with his Parkinson's for class effect of put him on a minimum dose 1 day a week.   Next appointment: 6 months   Medication Adjustments/Labs and Tests Ordered: Current medicines are reviewed at length with the patient today.  Concerns regarding medicines are outlined above.  Orders Placed This Encounter  Procedures  . EKG 12-Lead   Meds ordered this encounter  Medications  . rosuvastatin (CRESTOR) 10 MG tablet    Sig: Take 1 tablet (10 mg total) by mouth once a week.    Dispense:  60 tablet    Refill:  1    Chief Complaint  Patient presents with  . Follow-up  . Coronary Artery Disease  . Hypertension  .  Hyperlipidemia    History of Present Illness:    [name] [name] is a 84 y.o. male with a hx of CAD [facility], EF 45-50%, hypertension parkinson's diseaseand dyslipidemia  [facility]. Compliance with diet, lifestyle and medications: Yes  He is closely supervised at home with daughter and wife.  He has had no angina dyspnea palpitation or syncope but has had several falls that sound orthostatic in nature.  He has a relatively low blood pressure at baseline and standing.  His ARB.  On his own he had stopped his statin quite a while ago and for class effect upon him on a minimum dose one day per week and avoid higher dose and increased frequency with his neuromuscular disease Past Medical History:  Diagnosis Date  . Acute cystitis with hematuria 06/11/2018  . Altered mental status 04/15/2016  . Anemia, chronic disease 07/17/2017  . At high risk for falls 12/24/2017  . Atherosclerotic heart disease of native coronary artery without angina pectoris 01/31/2016   1 stent 2006.  Neg ST 07/2011.  Carrah Eppolito.  Overview:  [facility] Stress echo negative for ischemia at 5 mets Oct 2014 with EF 45-50% and inferior hypokinesia at rest Overview:  [facility] Stress echo negative  for ischemia at 5 mets Oct 2014 with EF 45-50% and inferior hypokinesia at rest  . Benign prostatic hyperplasia 03/06/2016  . Corrosive esophagitis 03/06/2016   Overview:  Overview:  Schotskis ring  Overview:  Schotskis ring  . Dyslipidemia 01/31/2016  . Encephalopathy acute 06/11/2018  . High risk medication use 03/06/2016  . Hyperlipidemia 07/31/2011  . Hypertension 07/31/2011  . Hypertensive heart disease 01/31/2016  . Increased band cell count 04/15/2016  . Malaise and fatigue 12/24/2017  . Neurogenic bladder 03/08/2018   Follows up regularly with Dr. Debroah Balleroberto Chao  . Obstructive sleep apnea 07/31/2011  . Parkinson's disease  (HCC) 08/03/2011   Choses not to see neurology  . Polyosteoarthritis 03/06/2016  . Prediabetes 03/09/2016  . Sepsis (HCC) 04/15/2016  . Sinus bradycardia 01/31/2016   Danarius Mcconathy.  . Urinary retention 06/11/2018  . Urinary tract infection 04/15/2016    Past Surgical History:  Procedure Laterality Date  . CATARACT EXTRACTION, BILATERAL    . CORONARY ANGIOPLASTY WITH STENT PLACEMENT    . INGUINAL HERNIA REPAIR    . KNEE SURGERY Bilateral   . PROSTATE SURGERY      Current Medications: Current Meds  Medication Sig  . aspirin EC 81 MG tablet Take 81 mg by mouth daily.  . finasteride (PROSCAR) 5 MG tablet Take 5 mg by mouth daily.  . furosemide (LASIX) 20 MG tablet TAKE ONE (1) TABLET ONCE DAILY  . nitroGLYCERIN (NITROSTAT) 0.4 MG SL tablet PLACE 1 TABLET UNDER TONGUE AS NEEDED FOR CHEST PAIN. MAY REPEAT EVERY 5 MIN. FOR 3 DOSES.IF NO RELIEF CALL 911.  . tamsulosin (FLOMAX) 0.4 MG CAPS capsule Take 0.4 mg by mouth daily.  . [DISCONTINUED] losartan (COZAAR) 25 MG tablet TAKE ONE (1) TABLET ONCE DAILY     Allergies:   Gemfibrozil; Lisinopril; and Niacin   Social History   Socioeconomic History  . Marital status: Married    Spouse name: Not on file  . Number of children: Not on file  . Years of education: Not on file  . Highest education level: Not on file  Occupational History  . Not on file  Social Needs  . Financial resource strain: Not on file  . Food insecurity:    Worry: Not on file    Inability: Not on file  . Transportation needs:    Medical: Not on file    Non-medical: Not on file  Tobacco Use  . Smoking status: Never Smoker  . Smokeless tobacco: Never Used  Substance and Sexual Activity  . Alcohol use: Not Currently  . Drug use: Not Currently  . Sexual activity: Not on file  Lifestyle  . Physical activity:    Days per week: Not on file    Minutes per session: Not on file  . Stress: Not on file  Relationships  . Social connections:    Talks on phone: Not on file     Gets together: Not on file    Attends religious service: Not on file    Active member of club or organization: Not on file    Attends meetings of clubs or organizations: Not on file    Relationship status: Not on file  Other Topics Concern  . Not on file  Social History Narrative  . Not on file     Family History: The patient's family history includes Bradycardia in his mother; Cancer in his sister; Colon cancer in his father; Heart Problems in his mother. ROS:   Please see the history of  present illness.    All other systems reviewed and are negative.  EKGs/Labs/Other Studies Reviewed:    The following studies were reviewed today:  EKG:  EKG ordered today.  The ekg ordered today demonstrates sinus rhythm nonspecific ST abnormality minor  Recent Labs:   12/24/17 CMP normal TSH normal No results found for requested labs within last 8760 hours.  Recent Lipid Panel   10/04/16 Chol 201 HDL 42 LDL 80    Component Value Date/Time   CHOL 147 07/07/2011 0600   TRIG 290 (H) 07/07/2011 0600   HDL 26 (L) 07/07/2011 0600   CHOLHDL 5.7 07/07/2011 0600   VLDL 58 (H) 07/07/2011 0600   LDLCALC 63 07/07/2011 0600    Physical Exam:    VS:  BP 120/62 (BP Location: Right Arm, Patient Position: Sitting, Cuff Size: Normal)   Pulse 67   Ht 5\' 7"  (1.702 m)   Wt 139 lb (63 kg)   SpO2 93%   BMI 21.77 kg/m     Wt Readings from Last 3 Encounters:  06/18/18 139 lb (63 kg)     GEN:  Well nourished, well developed in no acute distress HEENT: Normal NECK: No JVD; No carotid bruits LYMPHATICS: No lymphadenopathy CARDIAC: RRR, no murmurs, rubs, gallops RESPIRATORY:  Clear to auscultation without rales, wheezing or rhonchi  ABDOMEN: Soft, non-tender, non-distended MUSCULOSKELETAL:  No edema; No deformity  SKIN: Warm and dry NEUROLOGIC:  Alert and oriented x 3 PSYCHIATRIC:  Normal affect    Signed, Norman Herrlich, MD  06/18/2018 11:44 AM    [facility]

## 2018-06-18 ENCOUNTER — Ambulatory Visit (INDEPENDENT_AMBULATORY_CARE_PROVIDER_SITE_OTHER): Payer: Medicare Other | Admitting: Cardiology

## 2018-06-18 ENCOUNTER — Other Ambulatory Visit: Payer: Self-pay | Admitting: Cardiology

## 2018-06-18 ENCOUNTER — Encounter: Payer: Self-pay | Admitting: Cardiology

## 2018-06-18 VITALS — BP 120/62 | HR 67 | Ht 67.0 in | Wt 139.0 lb

## 2018-06-18 DIAGNOSIS — I25119 Atherosclerotic heart disease of native coronary artery with unspecified angina pectoris: Secondary | ICD-10-CM

## 2018-06-18 DIAGNOSIS — R001 Bradycardia, unspecified: Secondary | ICD-10-CM

## 2018-06-18 DIAGNOSIS — I2510 Atherosclerotic heart disease of native coronary artery without angina pectoris: Secondary | ICD-10-CM

## 2018-06-18 DIAGNOSIS — E785 Hyperlipidemia, unspecified: Secondary | ICD-10-CM

## 2018-06-18 DIAGNOSIS — I119 Hypertensive heart disease without heart failure: Secondary | ICD-10-CM | POA: Diagnosis not present

## 2018-06-18 MED ORDER — ROSUVASTATIN CALCIUM 10 MG PO TABS: 10.0000 mg | ORAL_TABLET | ORAL | 1 refills | Status: AC

## 2018-06-18 NOTE — Patient Instructions (Signed)
Medication Instructions:  Your physician has recommended you make the following change in your medication:   STOP: Losartan.  START: Crestor 10 mg once weekly.   Labwork: None.  Testing/Procedures: None.  Follow-Up: Your physician wants you to follow-up in: 6 months. You will receive a reminder letter in the mail two months in advance. If you don't receive a letter, please call our office to schedule the follow-up appointment.   Any Other Special Instructions Will Be Listed Below (If Applicable).     If you need a refill on your cardiac medications before your next appointment, please call your pharmacy.    Rosuvastatin Tablets What is this medicine? ROSUVASTATIN (roe SOO va sta tin) is known as a HMG-CoA reductase inhibitor or 'statin'. It lowers cholesterol and triglycerides in the blood. This drug may also reduce the risk of heart attack, stroke, or other health problems in patients with risk factors for heart disease. Diet and lifestyle changes are often used with this drug. This medicine may be used for other purposes; ask your health care provider or pharmacist if you have questions. COMMON BRAND NAME(S): Crestor What should I tell my health care provider before I take this medicine? They need to know if you have any of these conditions: -frequently drink alcoholic beverages -kidney disease -liver disease -muscle aches or weakness -other medical condition -an unusual or allergic reaction to rosuvastatin, other medicines, foods, dyes, or preservatives -pregnant or trying to get pregnant -breast-feeding How should I use this medicine? Take this medicine by mouth with a glass of water. Follow the directions on the prescription label. Do not cut, crush or chew this medicine. You can take this medicine with or without food. Take your doses at regular intervals. Do not take your medicine more often than directed. Talk to your pediatrician regarding the use of this medicine  in children. While this drug may be prescribed for children as young as 82 years old for selected conditions, precautions do apply. Overdosage: If you think you have taken too much of this medicine contact a poison control center or emergency room at once. NOTE: This medicine is only for you. Do not share this medicine with others. What if I miss a dose? If you miss a dose, take it as soon as you can. Do not take 2 doses within 12 hours of each other. If there are less than 12 hours until your next dose, take only that dose. Do not take double or extra doses. What may interact with this medicine? Do not take this medicine with any of the following medications: -herbal medicines like red yeast rice This medicine may also interact with the following medications: -alcohol -antacids containing aluminum hydroxide or magnesium hydroxide -cyclosporine -other medicines for high cholesterol -some medicines for HIV infection -warfarin This list may not describe all possible interactions. Give your health care provider a list of all the medicines, herbs, non-prescription drugs, or dietary supplements you use. Also tell them if you smoke, drink alcohol, or use illegal drugs. Some items may interact with your medicine. What should I watch for while using this medicine? Visit your doctor or health care professional for regular check-ups. You may need regular tests to make sure your liver is working properly. Tell your doctor or health care professional right away if you get any unexplained muscle pain, tenderness, or weakness, especially if you also have a fever and tiredness. Your doctor or health care professional may tell you to stop taking this medicine if  you develop muscle problems. If your muscle problems do not go away after stopping this medicine, contact your health care professional. This medicine may affect blood sugar levels. If you have diabetes, check with your doctor or health care professional  before you change your diet or the dose of your diabetic medicine. Avoid taking antacids containing aluminum, calcium or magnesium within 2 hours of taking this medicine. This drug is only part of a total heart-health program. Your doctor or a dietician can suggest a low-cholesterol and low-fat diet to help. Avoid alcohol and smoking, and keep a proper exercise schedule. Do not use this drug if you are pregnant or breast-feeding. Serious side effects to an unborn child or to an infant are possible. Talk to your doctor or pharmacist for more information. What side effects may I notice from receiving this medicine? Side effects that you should report to your doctor or health care professional as soon as possible: -allergic reactions like skin rash, itching or hives, swelling of the face, lips, or tongue -dark urine -fever -joint pain -muscle cramps, pain -redness, blistering, peeling or loosening of the skin, including inside the mouth -trouble passing urine or change in the amount of urine -unusually weak or tired -yellowing of the eyes or skin Side effects that usually do not require medical attention (report to your doctor or health care professional if they continue or are bothersome): -constipation -heartburn -nausea -stomach gas, pain, upset This list may not describe all possible side effects. Call your doctor for medical advice about side effects. You may report side effects to FDA at 1-800-FDA-1088. Where should I keep my medicine? Keep out of the reach of children. Store at room temperature between 20 and 25 degrees C (68 and 77 degrees F). Keep container tightly closed (protect from moisture). Throw away any unused medicine after the expiration date. NOTE: This sheet is a summary. It may not cover all possible information. If you have questions about this medicine, talk to your doctor, pharmacist, or health care provider.  2018 Elsevier/Gold Standard (2015-04-01 13:33:08)

## 2018-07-09 ENCOUNTER — Other Ambulatory Visit: Payer: Self-pay | Admitting: Cardiology

## 2018-07-29 DIAGNOSIS — J810 Acute pulmonary edema: Secondary | ICD-10-CM | POA: Diagnosis not present

## 2018-07-29 DIAGNOSIS — J181 Lobar pneumonia, unspecified organism: Secondary | ICD-10-CM | POA: Diagnosis not present

## 2018-07-29 DIAGNOSIS — R531 Weakness: Secondary | ICD-10-CM | POA: Diagnosis not present

## 2018-07-29 DIAGNOSIS — G20 Parkinson's disease: Secondary | ICD-10-CM

## 2018-07-29 DIAGNOSIS — I2510 Atherosclerotic heart disease of native coronary artery without angina pectoris: Secondary | ICD-10-CM | POA: Diagnosis not present

## 2018-07-29 DIAGNOSIS — R8290 Unspecified abnormal findings in urine: Secondary | ICD-10-CM | POA: Diagnosis not present

## 2018-07-29 DIAGNOSIS — R002 Palpitations: Secondary | ICD-10-CM

## 2018-07-30 DIAGNOSIS — I2510 Atherosclerotic heart disease of native coronary artery without angina pectoris: Secondary | ICD-10-CM | POA: Diagnosis not present

## 2018-07-30 DIAGNOSIS — R8290 Unspecified abnormal findings in urine: Secondary | ICD-10-CM | POA: Diagnosis not present

## 2018-07-30 DIAGNOSIS — I5041 Acute combined systolic (congestive) and diastolic (congestive) heart failure: Secondary | ICD-10-CM | POA: Diagnosis not present

## 2018-07-30 DIAGNOSIS — I10 Essential (primary) hypertension: Secondary | ICD-10-CM

## 2018-07-30 DIAGNOSIS — E785 Hyperlipidemia, unspecified: Secondary | ICD-10-CM

## 2018-07-30 DIAGNOSIS — J181 Lobar pneumonia, unspecified organism: Secondary | ICD-10-CM | POA: Diagnosis not present

## 2018-07-30 DIAGNOSIS — R531 Weakness: Secondary | ICD-10-CM | POA: Diagnosis not present

## 2018-07-31 ENCOUNTER — Inpatient Hospital Stay (HOSPITAL_COMMUNITY)
Admission: EM | Admit: 2018-07-31 | Discharge: 2018-08-30 | DRG: 286 | Disposition: E | Payer: Medicare Other | Attending: Family Medicine | Admitting: Family Medicine

## 2018-07-31 ENCOUNTER — Other Ambulatory Visit: Payer: Self-pay | Admitting: *Deleted

## 2018-07-31 ENCOUNTER — Other Ambulatory Visit: Payer: Self-pay

## 2018-07-31 ENCOUNTER — Telehealth: Payer: Self-pay | Admitting: Cardiology

## 2018-07-31 DIAGNOSIS — I25119 Atherosclerotic heart disease of native coronary artery with unspecified angina pectoris: Secondary | ICD-10-CM | POA: Diagnosis present

## 2018-07-31 DIAGNOSIS — E785 Hyperlipidemia, unspecified: Secondary | ICD-10-CM | POA: Diagnosis present

## 2018-07-31 DIAGNOSIS — Z9842 Cataract extraction status, left eye: Secondary | ICD-10-CM

## 2018-07-31 DIAGNOSIS — I119 Hypertensive heart disease without heart failure: Secondary | ICD-10-CM | POA: Diagnosis present

## 2018-07-31 DIAGNOSIS — M159 Polyosteoarthritis, unspecified: Secondary | ICD-10-CM | POA: Diagnosis present

## 2018-07-31 DIAGNOSIS — R627 Adult failure to thrive: Secondary | ICD-10-CM | POA: Diagnosis present

## 2018-07-31 DIAGNOSIS — Z9841 Cataract extraction status, right eye: Secondary | ICD-10-CM

## 2018-07-31 DIAGNOSIS — Z888 Allergy status to other drugs, medicaments and biological substances status: Secondary | ICD-10-CM

## 2018-07-31 DIAGNOSIS — G4733 Obstructive sleep apnea (adult) (pediatric): Secondary | ICD-10-CM | POA: Diagnosis present

## 2018-07-31 DIAGNOSIS — Z8249 Family history of ischemic heart disease and other diseases of the circulatory system: Secondary | ICD-10-CM

## 2018-07-31 DIAGNOSIS — R531 Weakness: Secondary | ICD-10-CM | POA: Diagnosis not present

## 2018-07-31 DIAGNOSIS — R0989 Other specified symptoms and signs involving the circulatory and respiratory systems: Secondary | ICD-10-CM

## 2018-07-31 DIAGNOSIS — D638 Anemia in other chronic diseases classified elsewhere: Secondary | ICD-10-CM | POA: Diagnosis present

## 2018-07-31 DIAGNOSIS — Z955 Presence of coronary angioplasty implant and graft: Secondary | ICD-10-CM

## 2018-07-31 DIAGNOSIS — G20 Parkinson's disease: Secondary | ICD-10-CM | POA: Diagnosis present

## 2018-07-31 DIAGNOSIS — Z7982 Long term (current) use of aspirin: Secondary | ICD-10-CM

## 2018-07-31 DIAGNOSIS — Z515 Encounter for palliative care: Secondary | ICD-10-CM | POA: Diagnosis present

## 2018-07-31 DIAGNOSIS — I5021 Acute systolic (congestive) heart failure: Secondary | ICD-10-CM

## 2018-07-31 DIAGNOSIS — N400 Enlarged prostate without lower urinary tract symptoms: Secondary | ICD-10-CM | POA: Diagnosis present

## 2018-07-31 DIAGNOSIS — J9601 Acute respiratory failure with hypoxia: Secondary | ICD-10-CM | POA: Diagnosis not present

## 2018-07-31 DIAGNOSIS — I255 Ischemic cardiomyopathy: Secondary | ICD-10-CM

## 2018-07-31 DIAGNOSIS — Z79890 Hormone replacement therapy (postmenopausal): Secondary | ICD-10-CM

## 2018-07-31 DIAGNOSIS — I952 Hypotension due to drugs: Principal | ICD-10-CM | POA: Diagnosis present

## 2018-07-31 DIAGNOSIS — R64 Cachexia: Secondary | ICD-10-CM | POA: Diagnosis present

## 2018-07-31 DIAGNOSIS — Z7189 Other specified counseling: Secondary | ICD-10-CM

## 2018-07-31 DIAGNOSIS — I110 Hypertensive heart disease with heart failure: Secondary | ICD-10-CM | POA: Diagnosis present

## 2018-07-31 DIAGNOSIS — I2582 Chronic total occlusion of coronary artery: Secondary | ICD-10-CM | POA: Diagnosis present

## 2018-07-31 DIAGNOSIS — I5042 Chronic combined systolic (congestive) and diastolic (congestive) heart failure: Secondary | ICD-10-CM | POA: Diagnosis present

## 2018-07-31 DIAGNOSIS — Z66 Do not resuscitate: Secondary | ICD-10-CM | POA: Diagnosis present

## 2018-07-31 DIAGNOSIS — Z800 Family history of malignant neoplasm of digestive organs: Secondary | ICD-10-CM

## 2018-07-31 DIAGNOSIS — I5043 Acute on chronic combined systolic (congestive) and diastolic (congestive) heart failure: Secondary | ICD-10-CM | POA: Diagnosis present

## 2018-07-31 DIAGNOSIS — N319 Neuromuscular dysfunction of bladder, unspecified: Secondary | ICD-10-CM | POA: Diagnosis present

## 2018-07-31 LAB — BASIC METABOLIC PANEL
ANION GAP: 8 (ref 5–15)
BUN: 19 mg/dL (ref 8–23)
CALCIUM: 8.7 mg/dL — AB (ref 8.9–10.3)
CO2: 25 mmol/L (ref 22–32)
CREATININE: 0.99 mg/dL (ref 0.61–1.24)
Chloride: 106 mmol/L (ref 98–111)
Glucose, Bld: 108 mg/dL — ABNORMAL HIGH (ref 70–99)
Potassium: 4 mmol/L (ref 3.5–5.1)
SODIUM: 139 mmol/L (ref 135–145)

## 2018-07-31 LAB — CBC
HCT: 34.3 % — ABNORMAL LOW (ref 39.0–52.0)
Hemoglobin: 10.9 g/dL — ABNORMAL LOW (ref 13.0–17.0)
MCH: 32.2 pg (ref 26.0–34.0)
MCHC: 31.8 g/dL (ref 30.0–36.0)
MCV: 101.5 fL — ABNORMAL HIGH (ref 78.0–100.0)
PLATELETS: 176 10*3/uL (ref 150–400)
RBC: 3.38 MIL/uL — AB (ref 4.22–5.81)
RDW: 14.6 % (ref 11.5–15.5)
WBC: 4.7 10*3/uL (ref 4.0–10.5)

## 2018-07-31 LAB — CBG MONITORING, ED: GLUCOSE-CAPILLARY: 86 mg/dL (ref 70–99)

## 2018-07-31 LAB — I-STAT CG4 LACTIC ACID, ED: LACTIC ACID, VENOUS: 1.12 mmol/L (ref 0.5–1.9)

## 2018-07-31 LAB — TROPONIN I: TROPONIN I: 0.05 ng/mL — AB (ref <0.03)

## 2018-07-31 LAB — BRAIN NATRIURETIC PEPTIDE: B NATRIURETIC PEPTIDE 5: 654.8 pg/mL — AB (ref 0.0–100.0)

## 2018-07-31 MED ORDER — SODIUM CHLORIDE 0.9 % IV BOLUS
500.0000 mL | Freq: Once | INTRAVENOUS | Status: AC
  Administered 2018-07-31: 500 mL via INTRAVENOUS

## 2018-07-31 MED ORDER — SACUBITRIL-VALSARTAN 24-26 MG PO TABS: 1.0000 | ORAL_TABLET | Freq: Two times a day (BID) | ORAL | 3 refills | Status: DC

## 2018-07-31 NOTE — Telephone Encounter (Signed)
Patient's daughter called and they are having some issue with getting help on Eltresto and they have questions. Patient is having cath tomorrow and he has been off Losartin after dc at [facility]. Please call daughter.

## 2018-07-31 NOTE — Telephone Encounter (Signed)
Spoke with patient's daughter, Dorinda Hill, per DPR stating that patient was just discharged from the hospital today. He went to the ED on Monday, 07/29/18 and was admitted with congestive heart failure. Patient is scheduled for heart catheterization in the morning. The hospital discontinued losartan and added entresto 24-26 mg twice daily. A prior authorization was initiated and denied. PA was appealed and patient was given 4 pills of entresto to last through tomorrow. Refilled entresto with free 30 day supply card information while PA is processing. Melessa agreeable to plan. No further questions. Advised her to contact our office with any further questions or concerns.

## 2018-07-31 NOTE — ED Provider Notes (Signed)
[facility] EMERGENCY DEPARTMENT Provider Note   CSN: [id number] Arrival date & time: 08/29/2018  2054     History   Chief Complaint Chief Complaint  Patient presents with  . Near Syncope    HPI IZMAEL DUROSS is a 84 y.o. male.  HPI   84 year old male with PMH notable for Parkinson's disease, distant MI, new onset CHF, last known EF 20-25%, who presents with weakness and abdominal pain.  Patient with recent admission to [facility] for shortness of breath found to have new onset heart failure.  BNP 4450, troponin 0 0.07.  Patient treated with IV Lasix 40 mg and discharged home after resolution of symptoms.  Patient discharged on Entresto and carvedilol.  Patient discharged 1 day prior to arrival.  On day of arrival patient describes having "weakness in legs" upon standing.  Denies lightheadedness or dizziness.  Denies nausea vomiting or infectious symptoms.  Endorses transient episode of epigastric abdominal pain that has fully resolved.  Patient describes the pain as pressure, nonradiating, nothing made better, spontaneously resolved.  LBM today, normal.  EMS was called to found patient hypotensive to 80s systolic.  Patient with normal mentation by EMS.   Past Medical History:  Diagnosis Date  . Acute cystitis with hematuria 06/11/2018  . Altered mental status 04/15/2016  . Anemia, chronic disease 07/17/2017  . At high risk for falls 12/24/2017  . Atherosclerotic heart disease of native coronary artery without angina pectoris 01/31/2016   1 stent 2006.  Neg ST 07/2011.  [place].  Overview:  PCI and DES to [facility] for unstable angina in 2006 Stress echo negative for ischemia at 5 mets Oct 2014 with EF 45-50% and inferior hypokinesia at rest Overview:  PCI and DES to [facility] for unstable angina in 2006 Stress echo negative for ischemia at 5 mets Oct 2014 with EF 45-50% and inferior hypokinesia at rest  . Benign prostatic hyperplasia 03/06/2016  . Corrosive esophagitis  03/06/2016   Overview:  Overview:  Schotskis ring  Overview:  Schotskis ring  . Dyslipidemia 01/31/2016  . Encephalopathy acute 06/11/2018  . High risk medication use 03/06/2016  . Hyperlipidemia 07/31/2011  . Hypertension 07/31/2011  . Hypertensive heart disease 01/31/2016  . Increased band cell count 04/15/2016  . Malaise and fatigue 12/24/2017  . Neurogenic bladder 03/08/2018   Follows up regularly with Dr. Debroah Baller  . Obstructive sleep apnea 07/31/2011  . Parkinson's disease (HCC) 08/03/2011   Choses not to see neurology  . Polyosteoarthritis 03/06/2016  . Prediabetes 03/09/2016  . Sepsis (HCC) 04/15/2016  . Sinus bradycardia 01/31/2016   [place].  . Urinary retention 06/11/2018  . Urinary tract infection 04/15/2016    Patient Active Problem List   Diagnosis Date Noted  . Acute cystitis with hematuria 06/11/2018  . Encephalopathy acute 06/11/2018  . Urinary retention 06/11/2018  . Neurogenic bladder 03/08/2018  . At high risk for falls 12/24/2017  . Malaise and fatigue 12/24/2017  . Anemia, chronic disease 07/17/2017  . Altered mental status 04/15/2016  . Increased band cell count 04/15/2016  . Sepsis (HCC) 04/15/2016  . Urinary tract infection 04/15/2016  . Prediabetes 03/09/2016  . Benign prostatic hyperplasia 03/06/2016  . Corrosive esophagitis 03/06/2016  . High risk medication use 03/06/2016  . Polyosteoarthritis 03/06/2016  . Coronary artery disease involving native coronary artery of native heart with angina pectoris (HCC) 01/31/2016  . Dyslipidemia 01/31/2016  . Hypertensive heart disease 01/31/2016  . Sinus bradycardia 01/31/2016  . Parkinson's disease (HCC)  08/03/2011  . Hyperlipidemia 07/31/2011  . Hypertension 07/31/2011  . Obstructive sleep apnea 07/31/2011    Past Surgical History:  Procedure Laterality Date  . CATARACT EXTRACTION, BILATERAL    . CORONARY ANGIOPLASTY WITH STENT PLACEMENT    . INGUINAL HERNIA REPAIR    . KNEE SURGERY Bilateral   . PROSTATE  SURGERY          Home Medications    Prior to Admission medications   Medication Sig Start Date End Date Taking? Authorizing Provider  acetaminophen (TYLENOL) 325 MG tablet Take 325 mg by mouth every 6 (six) hours as needed (for pain).    Yes [provider]  aspirin EC 81 MG tablet Take 81 mg by mouth daily.    Yes [provider]  finasteride (PROSCAR) 5 MG tablet Take 5 mg by mouth daily. 01/31/18  Yes [provider]  nitroGLYCERIN (NITROSTAT) 0.4 MG SL tablet Place 0.4 mg under the tongue every 5 (five) minutes x 3 doses as needed for chest pain.  03/07/17  Yes [provider]  rosuvastatin (CRESTOR) 10 MG tablet Take 1 tablet (10 mg total) by mouth once a week. Patient taking differently: Take 10 mg by mouth every Friday.  06/18/18  Yes Baldo Daub, MD  sacubitril-valsartan (ENTRESTO) 24-26 MG Take 1 tablet by mouth 2 (two) times daily. 08/24/2018  Yes Baldo Daub, MD  tamsulosin (FLOMAX) 0.4 MG CAPS capsule Take 0.4 mg by mouth daily. 01/31/18  Yes [provider]  vitamin B-12 (CYANOCOBALAMIN) 1000 MCG tablet Take 1,000 mcg by mouth daily.   Yes [provider]  carvedilol (COREG) 3.125 MG tablet Take 3.125 mg by mouth 2 (two) times daily.    [provider]    Family History Family History  Problem Relation Age of Onset  . Heart Problems Mother   . Bradycardia Mother   . Colon cancer Father   . Cancer Sister     Social History Social History   Tobacco Use  . Smoking status: Never Smoker  . Smokeless tobacco: Never Used  Substance Use Topics  . Alcohol use: Not Currently  . Drug use: Not Currently     Allergies   Gemfibrozil; Lisinopril; and Niacin   Review of Systems Review of Systems  Constitutional: Negative for chills and fever.  HENT: Negative for ear pain and sore throat.   Eyes: Negative for pain and visual disturbance.  Respiratory: Negative for cough and shortness of breath.     Cardiovascular: Negative for chest pain and palpitations.  Gastrointestinal: Positive for abdominal pain. Negative for vomiting.  Genitourinary: Negative for dysuria and hematuria.  Musculoskeletal: Negative for arthralgias and back pain.  Skin: Negative for color change and rash.  Neurological: Positive for weakness. Negative for seizures and syncope.  All other systems reviewed and are negative.    Physical Exam Updated Vital Signs BP (!) 84/52   Pulse 64   Temp 98.7 F (37.1 C) (Rectal)   Resp 18   Ht 5' 7.5" (1.715 m)   Wt 59 kg   SpO2 97%   BMI 20.06 kg/m   Physical Exam  Constitutional: He appears well-developed and well-nourished. No distress.  Cachectic.  HENT:  Head: Normocephalic and atraumatic.  Eyes: Conjunctivae are normal.  Neck: Neck supple.  Cardiovascular: Normal rate and regular rhythm.  No murmur heard. Pulmonary/Chest: Effort normal and breath sounds normal. No respiratory distress.  Abdominal: Soft. There is no tenderness.  Musculoskeletal: He exhibits no edema.  Neurological: He is alert.  Skin: Skin is warm and dry. Capillary refill takes less than 2 seconds. He is not diaphoretic.  Psychiatric: He has a normal mood and affect.  Nursing note and vitals reviewed.    ED Treatments / Results  Labs (all labs ordered are listed, but only abnormal results are displayed) Labs Reviewed  BASIC METABOLIC PANEL - Abnormal; Notable for the following components:      Result Value   Glucose, Bld 108 (*)    Calcium 8.7 (*)    All other components within normal limits  CBC - Abnormal; Notable for the following components:   RBC 3.38 (*)    Hemoglobin 10.9 (*)    HCT 34.3 (*)    MCV 101.5 (*)    All other components within normal limits  BRAIN NATRIURETIC PEPTIDE - Abnormal; Notable for the following components:   B Natriuretic Peptide 654.8 (*)    All other components within normal limits  TROPONIN I - Abnormal; Notable for the following  components:   Troponin I 0.05 (*)    All other components within normal limits  URINALYSIS, ROUTINE W REFLEX MICROSCOPIC  CBG MONITORING, ED  I-STAT CG4 LACTIC ACID, ED    EKG EKG Interpretation  Date/Time:  Wednesday 08-Aug-2018 21:05:47 EDT Ventricular Rate:  62 PR Interval:    QRS Duration: 92 QT Interval:  447 QTC Calculation: 454 R Axis:   60 Text Interpretation:  Sinus rhythm Atrial premature complex Anterior infarct, old Nonspecific T abnormalities, lateral leads t waves less inverted compared to 2012 Confirmed by Pricilla Loveless [phone]) on 2018/08/08 9:32:05 PM   Radiology No results found.  Procedures Procedures (including critical care time)  Medications Ordered in ED Medications  sodium chloride 0.9 % bolus 500 mL (0 mLs Intravenous Stopped 08/08/2018 2304)     Initial Impression / Assessment and Plan / ED Course  I have reviewed the triage vital signs and the nursing notes.  Pertinent labs & imaging results that were available during my care of the patient were reviewed by me and considered in my medical decision making (see chart for details).     84 year old male with PMH notable for Parkinson's disease, distant MI, new onset CHF, last known EF 20-25%, who presents with weakness and abdominal pain.  History as above.  DDX includes ACS, infection, reaction to new blood pressure medications.  History and physical not consistent with infection yet unclear etiology of hypotension persists.  Patient without syncope or near syncope symptoms on history.  Patient persistently hypotensive despite 500 mils normal saline via EMS.  Patient given another 500 mils in the setting of recent heart failure.  We will be judicious in use.  Patient afebrile, non-tachycardic.  Labs and imaging reveal BNP 654.  Troponin 0.05.  Priors 4450 and 0.07 respectively at [facility] earlier this week.  EKG with signs of early repolarization without significant ST elevation or  depressions.  Patient with hypertension of unclear etiology.  Patient with normal mentation, afebrile, well appearance.  Hypertension thought secondary to recent addition of Entresto and carvedilol in setting of recent diagnosis of heart failure.  Believe the patient is not tolerating his new regimen.  Due to patient's normal mentation, pressors were not initiated.  Continue judicious use of fluids titrate to fluid status for low blood pressure.  Patient to be admitted to hospitalist for persistent hypertension and to receive scheduled heart cath tomorrow a.m.  Final Clinical Impressions(s) / ED Diagnoses  Final diagnoses:  Weakness    ED Discharge Orders    None       Margit Banda, MD 2018/08/14 3086    Pricilla Loveless, MD August 14, 2018 [phone]

## 2018-07-31 NOTE — ED Triage Notes (Signed)
Pt to ED after started feeling sob and dizzy tonight at home so he sat down and did not pass out. Recent dc from [facility] after dx with UTI and pulmonary edema. Scheduled for heart cath tomorrow here. Hypotensive with EMS 92/50, 500 ml NS bolus given PTA with no improvement. Hx Parkinsons, MI 2007.

## 2018-07-31 NOTE — Telephone Encounter (Signed)
Returned call-call back at [phone]

## 2018-07-31 NOTE — Telephone Encounter (Signed)
Left message for patient's daughter, Dorinda Hill, to return call per [facility].

## 2018-07-31 NOTE — ED Notes (Signed)
ED Provider at bedside. 

## 2018-08-01 ENCOUNTER — Encounter (HOSPITAL_COMMUNITY): Admission: EM | Disposition: E | Payer: Self-pay | Source: Home / Self Care | Attending: Internal Medicine

## 2018-08-01 ENCOUNTER — Other Ambulatory Visit: Payer: Self-pay

## 2018-08-01 ENCOUNTER — Encounter (HOSPITAL_COMMUNITY): Payer: Self-pay | Admitting: *Deleted

## 2018-08-01 ENCOUNTER — Ambulatory Visit (HOSPITAL_COMMUNITY): Admission: RE | Admit: 2018-08-01 | Payer: Medicare Other | Source: Ambulatory Visit | Admitting: Internal Medicine

## 2018-08-01 DIAGNOSIS — I5021 Acute systolic (congestive) heart failure: Secondary | ICD-10-CM

## 2018-08-01 DIAGNOSIS — Z79890 Hormone replacement therapy (postmenopausal): Secondary | ICD-10-CM | POA: Diagnosis not present

## 2018-08-01 DIAGNOSIS — I5043 Acute on chronic combined systolic (congestive) and diastolic (congestive) heart failure: Secondary | ICD-10-CM | POA: Diagnosis present

## 2018-08-01 DIAGNOSIS — I952 Hypotension due to drugs: Principal | ICD-10-CM

## 2018-08-01 DIAGNOSIS — R64 Cachexia: Secondary | ICD-10-CM | POA: Diagnosis present

## 2018-08-01 DIAGNOSIS — Z66 Do not resuscitate: Secondary | ICD-10-CM | POA: Diagnosis present

## 2018-08-01 DIAGNOSIS — I2582 Chronic total occlusion of coronary artery: Secondary | ICD-10-CM | POA: Diagnosis present

## 2018-08-01 DIAGNOSIS — N319 Neuromuscular dysfunction of bladder, unspecified: Secondary | ICD-10-CM | POA: Diagnosis present

## 2018-08-01 DIAGNOSIS — I5042 Chronic combined systolic (congestive) and diastolic (congestive) heart failure: Secondary | ICD-10-CM | POA: Diagnosis not present

## 2018-08-01 DIAGNOSIS — N400 Enlarged prostate without lower urinary tract symptoms: Secondary | ICD-10-CM | POA: Diagnosis present

## 2018-08-01 DIAGNOSIS — E785 Hyperlipidemia, unspecified: Secondary | ICD-10-CM | POA: Diagnosis present

## 2018-08-01 DIAGNOSIS — I25119 Atherosclerotic heart disease of native coronary artery with unspecified angina pectoris: Secondary | ICD-10-CM | POA: Diagnosis not present

## 2018-08-01 DIAGNOSIS — G4733 Obstructive sleep apnea (adult) (pediatric): Secondary | ICD-10-CM | POA: Diagnosis present

## 2018-08-01 DIAGNOSIS — I119 Hypertensive heart disease without heart failure: Secondary | ICD-10-CM

## 2018-08-01 DIAGNOSIS — G20 Parkinson's disease: Secondary | ICD-10-CM | POA: Diagnosis not present

## 2018-08-01 DIAGNOSIS — Z9841 Cataract extraction status, right eye: Secondary | ICD-10-CM | POA: Diagnosis not present

## 2018-08-01 DIAGNOSIS — I255 Ischemic cardiomyopathy: Secondary | ICD-10-CM | POA: Diagnosis not present

## 2018-08-01 DIAGNOSIS — R627 Adult failure to thrive: Secondary | ICD-10-CM | POA: Diagnosis present

## 2018-08-01 DIAGNOSIS — M159 Polyosteoarthritis, unspecified: Secondary | ICD-10-CM | POA: Diagnosis present

## 2018-08-01 DIAGNOSIS — J9601 Acute respiratory failure with hypoxia: Secondary | ICD-10-CM | POA: Diagnosis not present

## 2018-08-01 DIAGNOSIS — D638 Anemia in other chronic diseases classified elsewhere: Secondary | ICD-10-CM | POA: Diagnosis present

## 2018-08-01 DIAGNOSIS — Z9842 Cataract extraction status, left eye: Secondary | ICD-10-CM | POA: Diagnosis not present

## 2018-08-01 DIAGNOSIS — R531 Weakness: Secondary | ICD-10-CM | POA: Diagnosis present

## 2018-08-01 DIAGNOSIS — Z888 Allergy status to other drugs, medicaments and biological substances status: Secondary | ICD-10-CM | POA: Diagnosis not present

## 2018-08-01 DIAGNOSIS — Z7189 Other specified counseling: Secondary | ICD-10-CM | POA: Diagnosis not present

## 2018-08-01 DIAGNOSIS — Z800 Family history of malignant neoplasm of digestive organs: Secondary | ICD-10-CM | POA: Diagnosis not present

## 2018-08-01 DIAGNOSIS — Z8249 Family history of ischemic heart disease and other diseases of the circulatory system: Secondary | ICD-10-CM | POA: Diagnosis not present

## 2018-08-01 DIAGNOSIS — Z515 Encounter for palliative care: Secondary | ICD-10-CM | POA: Diagnosis not present

## 2018-08-01 DIAGNOSIS — I2510 Atherosclerotic heart disease of native coronary artery without angina pectoris: Secondary | ICD-10-CM

## 2018-08-01 DIAGNOSIS — I110 Hypertensive heart disease with heart failure: Secondary | ICD-10-CM | POA: Diagnosis present

## 2018-08-01 HISTORY — PX: RIGHT/LEFT HEART CATH AND CORONARY ANGIOGRAPHY: CATH118266

## 2018-08-01 LAB — BASIC METABOLIC PANEL
ANION GAP: 8 (ref 5–15)
BUN: 13 mg/dL (ref 8–23)
CALCIUM: 8.3 mg/dL — AB (ref 8.9–10.3)
CHLORIDE: 110 mmol/L (ref 98–111)
CO2: 22 mmol/L (ref 22–32)
CREATININE: 0.73 mg/dL (ref 0.61–1.24)
GFR calc non Af Amer: >60 mL/min (ref >60)
Glucose, Bld: 99 mg/dL (ref 70–99)
Potassium: 3.5 mmol/L (ref 3.5–5.1)
SODIUM: 140 mmol/L (ref 135–145)

## 2018-08-01 LAB — MRSA PCR SCREENING: MRSA BY PCR: NEGATIVE

## 2018-08-01 LAB — POCT I-STAT 3, VENOUS BLOOD GAS (G3P V)
ACID-BASE DEFICIT: 3 mmol/L — AB (ref 0.0–2.0)
Acid-base deficit: 2 mmol/L (ref 0.0–2.0)
BICARBONATE: 21.9 mmol/L (ref 20.0–28.0)
BICARBONATE: 23.4 mmol/L (ref 20.0–28.0)
O2 Saturation: 69 %
O2 Saturation: 72 %
PH VEN: 7.365 (ref 7.250–7.430)
PO2 VEN: 37 mmHg (ref 32.0–45.0)
PO2 VEN: 39 mmHg (ref 32.0–45.0)
TCO2: 23 mmol/L (ref 22–32)
TCO2: 25 mmol/L (ref 22–32)
pCO2, Ven: 38.2 mmHg — ABNORMAL LOW (ref 44.0–60.0)
pCO2, Ven: 40.6 mmHg — ABNORMAL LOW (ref 44.0–60.0)
pH, Ven: 7.369 (ref 7.250–7.430)

## 2018-08-01 LAB — CBC
HCT: 32.1 % — ABNORMAL LOW (ref 39.0–52.0)
Hemoglobin: 10.3 g/dL — ABNORMAL LOW (ref 13.0–17.0)
MCH: 32.4 pg (ref 26.0–34.0)
MCHC: 32.1 g/dL (ref 30.0–36.0)
MCV: 100.9 fL — ABNORMAL HIGH (ref 78.0–100.0)
PLATELETS: 157 10*3/uL (ref 150–400)
RBC: 3.18 MIL/uL — ABNORMAL LOW (ref 4.22–5.81)
RDW: 14.7 % (ref 11.5–15.5)
WBC: 4 10*3/uL (ref 4.0–10.5)

## 2018-08-01 LAB — POCT I-STAT 3, ART BLOOD GAS (G3+)
Acid-base deficit: 3 mmol/L — ABNORMAL HIGH (ref 0.0–2.0)
Bicarbonate: 21.2 mmol/L (ref 20.0–28.0)
O2 Saturation: 98 %
PCO2 ART: 33.9 mmHg (ref 32.0–48.0)
PH ART: 7.403 (ref 7.350–7.450)
PO2 ART: 102 mmHg (ref 83.0–108.0)
TCO2: 22 mmol/L (ref 22–32)

## 2018-08-01 LAB — LACTIC ACID, PLASMA: Lactic Acid, Venous: 0.8 mmol/L (ref 0.5–1.9)

## 2018-08-01 LAB — TROPONIN I
Troponin I: 0.05 ng/mL (ref <0.03)
Troponin I: 0.04 ng/mL (ref <0.03)

## 2018-08-01 SURGERY — RIGHT/LEFT HEART CATH AND CORONARY ANGIOGRAPHY
Anesthesia: LOCAL

## 2018-08-01 MED ORDER — ONDANSETRON HCL 4 MG/2ML IJ SOLN: 4.0000 mg | Freq: Four times a day (QID) | INTRAMUSCULAR | Status: DC | PRN

## 2018-08-01 MED ORDER — VERAPAMIL HCL 2.5 MG/ML IV SOLN
INTRAVENOUS | Status: AC
  Filled 2018-08-01: qty 2

## 2018-08-01 MED ORDER — ACETAMINOPHEN 325 MG PO TABS: 650.0000 mg | ORAL_TABLET | Freq: Four times a day (QID) | ORAL | Status: DC | PRN

## 2018-08-01 MED ORDER — SODIUM CHLORIDE 0.9% FLUSH: 3.0000 mL | INTRAVENOUS | Status: DC | PRN

## 2018-08-01 MED ORDER — SODIUM CHLORIDE 0.9 % IV SOLN
INTRAVENOUS | Status: DC
  Administered 2018-08-01: 06:00:00 via INTRAVENOUS

## 2018-08-01 MED ORDER — ONDANSETRON HCL 4 MG PO TABS: 4.0000 mg | ORAL_TABLET | Freq: Four times a day (QID) | ORAL | Status: DC | PRN

## 2018-08-01 MED ORDER — SODIUM CHLORIDE 0.9% FLUSH
3.0000 mL | Freq: Two times a day (BID) | INTRAVENOUS | Status: DC
  Administered 2018-08-01 – 2018-08-03 (×5): 3 mL via INTRAVENOUS

## 2018-08-01 MED ORDER — IOHEXOL 350 MG/ML SOLN
INTRAVENOUS | Status: DC | PRN
  Administered 2018-08-01: 55 mL via INTRA_ARTERIAL

## 2018-08-01 MED ORDER — ACETAMINOPHEN 650 MG RE SUPP: 650.0000 mg | Freq: Four times a day (QID) | RECTAL | Status: DC | PRN

## 2018-08-01 MED ORDER — HEPARIN SODIUM (PORCINE) 1000 UNIT/ML IJ SOLN
INTRAMUSCULAR | Status: DC | PRN
  Administered 2018-08-01: 3000 [IU] via INTRAVENOUS

## 2018-08-01 MED ORDER — LIDOCAINE HCL (PF) 1 % IJ SOLN
INTRAMUSCULAR | Status: AC
  Filled 2018-08-01: qty 30

## 2018-08-01 MED ORDER — ADENOSINE 6 MG/2ML IV SOLN
INTRAVENOUS | Status: AC
  Filled 2018-08-01: qty 2

## 2018-08-01 MED ORDER — ASPIRIN 81 MG PO CHEW
81.0000 mg | CHEWABLE_TABLET | ORAL | Status: AC
  Administered 2018-08-01: 81 mg via ORAL
  Filled 2018-08-01: qty 1

## 2018-08-01 MED ORDER — SODIUM CHLORIDE 0.9 % IV SOLN: 250.0000 mL | INTRAVENOUS | Status: DC | PRN

## 2018-08-01 MED ORDER — ASPIRIN EC 81 MG PO TBEC
81.0000 mg | DELAYED_RELEASE_TABLET | Freq: Every day | ORAL | Status: DC
  Administered 2018-08-01 – 2018-08-03 (×3): 81 mg via ORAL
  Filled 2018-08-01 (×3): qty 1

## 2018-08-01 MED ORDER — HEPARIN (PORCINE) IN NACL 1000-0.9 UT/500ML-% IV SOLN
INTRAVENOUS | Status: DC | PRN
  Administered 2018-08-01 (×2): 500 mL

## 2018-08-01 MED ORDER — SODIUM CHLORIDE 0.9% FLUSH: 3.0000 mL | Freq: Two times a day (BID) | INTRAVENOUS | Status: DC

## 2018-08-01 MED ORDER — SODIUM CHLORIDE 0.9 % IV SOLN
INTRAVENOUS | Status: AC
  Administered 2018-08-01: 11:00:00 via INTRAVENOUS

## 2018-08-01 MED ORDER — FINASTERIDE 5 MG PO TABS
5.0000 mg | ORAL_TABLET | Freq: Every day | ORAL | Status: DC
  Administered 2018-08-01 – 2018-08-03 (×3): 5 mg via ORAL
  Filled 2018-08-01 (×3): qty 1

## 2018-08-01 MED ORDER — VERAPAMIL HCL 2.5 MG/ML IV SOLN
INTRAVENOUS | Status: DC | PRN
  Administered 2018-08-01: 10 mL via INTRA_ARTERIAL

## 2018-08-01 MED ORDER — ENOXAPARIN SODIUM 40 MG/0.4ML ~~LOC~~ SOLN
40.0000 mg | SUBCUTANEOUS | Status: DC
  Administered 2018-08-02 – 2018-08-03 (×2): 40 mg via SUBCUTANEOUS
  Filled 2018-08-01 (×2): qty 0.4

## 2018-08-01 MED ORDER — ENOXAPARIN SODIUM 40 MG/0.4ML ~~LOC~~ SOLN
40.0000 mg | SUBCUTANEOUS | Status: DC
  Filled 2018-08-01: qty 0.4

## 2018-08-01 MED ORDER — HEPARIN (PORCINE) IN NACL 1000-0.9 UT/500ML-% IV SOLN
INTRAVENOUS | Status: AC
  Filled 2018-08-01: qty 1000

## 2018-08-01 MED ORDER — SODIUM CHLORIDE 0.9 % IV BOLUS
500.0000 mL | Freq: Once | INTRAVENOUS | Status: AC
  Administered 2018-08-01: 500 mL via INTRAVENOUS

## 2018-08-01 MED ORDER — ACETAMINOPHEN 325 MG PO TABS: 650.0000 mg | ORAL_TABLET | ORAL | Status: DC | PRN

## 2018-08-01 MED ORDER — ROSUVASTATIN CALCIUM 10 MG PO TABS
10.0000 mg | ORAL_TABLET | ORAL | Status: DC
  Administered 2018-08-02: 10 mg via ORAL
  Filled 2018-08-01: qty 1

## 2018-08-01 MED ORDER — LIDOCAINE HCL (PF) 1 % IJ SOLN
INTRAMUSCULAR | Status: DC | PRN
  Administered 2018-08-01 (×2): 2 mL via INTRADERMAL

## 2018-08-01 SURGICAL SUPPLY — 15 items
CATH 5FR JL3.5 JR4 ANG PIG MP (CATHETERS) ×1 IMPLANT
CATH BALLN WEDGE 5F 110CM (CATHETERS) ×1 IMPLANT
DEVICE RAD COMP TR BAND LRG (VASCULAR PRODUCTS) ×1 IMPLANT
ELECT DEFIB PAD ADLT CADENCE (PAD) ×1 IMPLANT
GLIDESHEATH SLEND SS 6F .021 (SHEATH) ×1 IMPLANT
GUIDEWIRE .025 260CM (WIRE) ×1 IMPLANT
GUIDEWIRE INQWIRE 1.5J.035X260 (WIRE) IMPLANT
INQWIRE 1.5J .035X260CM (WIRE) ×2
KIT HEART LEFT (KITS) ×2 IMPLANT
PACK CARDIAC CATHETERIZATION (CUSTOM PROCEDURE TRAY) ×2 IMPLANT
PROTECTION STATION PRESSURIZED (MISCELLANEOUS) ×2
SHEATH GLIDE SLENDER 4/5FR (SHEATH) ×1 IMPLANT
STATION PROTECTION PRESSURIZED (MISCELLANEOUS) IMPLANT
TRANSDUCER W/STOPCOCK (MISCELLANEOUS) ×2 IMPLANT
TUBING CIL FLEX 10 FLL-RA (TUBING) ×2 IMPLANT

## 2018-08-01 NOTE — Progress Notes (Signed)
Day of Surgery Procedure(s) (LRB): RIGHT/LEFT HEART CATH AND CORONARY ANGIOGRAPHY (N/A) Subjective: Patient examined, images of coronary angiograms personally reviewed and discussed with patient and family 84 year old frail and sedentary male with history of Parkinson's disease, sleep apnea, polyarthritis, urinary retention, chronic diastolic and systolic heart failure and prior PCI admitted with shortness of breath and weakness and presyncope recently discharged from [facility].  Cardiac catheterization demonstrated severe recurrent coronary disease with severe LV dysfunction.  EF 20%.  The LAD and distal right coronaries are atretic and poor targets for grafting.  PCI and circumflex are patent. The patient is a poor candidate for CABG because of poor LV function, poor targets for grafting, and severe comorbid medical problems including advanced age, frailty, Parkinson disease, polyarthritis and anemia. Objective: Vital signs in last 24 hours: Temp:  [97.6 F (36.4 C)-98.7 F (37.1 C)] 97.7 F (36.5 C) (10/03 1548) Pulse Rate:  [0-91] 91 (10/03 1548) Cardiac Rhythm: Normal sinus rhythm (10/03 1200) Resp:  [0-24] 24 (10/03 1548) BP: (82-114)/(45-71) 112/71 (10/03 1548) SpO2:  [0 %-100 %] 97 % (10/03 1548) Weight:  [59 kg-60.1 kg] 60.1 kg (10/03 0200)  Hemodynamic parameters for last 24 hours:    Intake/Output from previous day: 10/02 0701 - 10/03 0700 In: 5501.5 [I.V.:1.5; IV Piggyback:5500] Out: -  Intake/Output this shift: Total I/O In: 1428.6 [I.V.:1428.6] Out: -        Exam    General-elderly frail male responsive but with much effort needed    Neck- no JVD, no cervical adenopathy palpable, no carotid bruit   Lungs-distant breath sounds   Cor- regular rate and rhythm, no murmur , gallop   Abdomen- soft, non-tender   Extremities - warm, non-tender, minimal edema   Neuro- oriented, generally weak   Lab Results: Recent Labs    08/13/2018 2105 08-08-2018 0646  WBC  4.7 4.0  HGB 10.9* 10.3*  HCT 34.3* 32.1*  PLT 176 157   BMET:  Recent Labs    08/02/2018 2105 08-08-18 0646  NA 139 140  K 4.0 3.5  CL 106 110  CO2 25 22  GLUCOSE 108* 99  BUN 19 13  CREATININE 0.99 0.73  CALCIUM 8.7* 8.3*    PT/INR: No results for input(s): LABPROT, INR in the last 72 hours. ABG No results found for: PHART, HCO3, TCO2, ACIDBASEDEF, O2SAT CBG (last 3)  Recent Labs    08/08/2018 2105  GLUCAP 86    Assessment/Plan: S/P Procedure(s) (LRB): RIGHT/LEFT HEART CATH AND CORONARY ANGIOGRAPHY (N/A) Severe multivessel coronary disease with poor LV function, poor targets for grafting, severe comorbid disease in  this 84 year old male Would not recommend CABG for  this patient.  Consider PCI or medical therapy.   LOS: 0 days    Kathlee Nations Trigt III 08/08/2018

## 2018-08-01 NOTE — Consult Note (Addendum)
Advanced HF Team Consultation:   Patient ID: [name] [name] MRN: [id number]; DOB: 12-08-1933  Admit date: 09-Aug-2018 Date of Consult: 08/28/2018  Primary Care Provider: Gordan Payment., MD Primary Cardiologist: Norman Herrlich, MD    Patient Profile:   [name] [name] is a 84 y.o. male with a hx of  CAD with PCI and DES to [facility] in 2006, EF 45-50%, hypertension parkinson's disease and dyslipidemia who is being seen today for the evaluation acute systolic HF and hypotension after taking Entresto at the request of Dr. Tyson Babinski.  History of Present Illness:   [name] [name] is a 84 y.o. male with a hx of CAD with PCI and DES to [facility] in 2006, EF 45-50%, hypertension parkinson's disease and dyslipidemia.  Pt was recently found to have new decreased EF 20-25% after being admitted to [facility] for shortness of breath and weakness.  He was diuresed and started on new HF meds Entresto and Coreg. He had not started croeg yet but had one dose of Entresto. He is scheduled to have R&L heart cath today. He presented to the hospital last night with generalized weakness, lightheadedness, near syncope today after taking entresto.  EMS called and found patient to be hypotensive to 80s systolic.He has been given IV fluids. Troponins mildly elevated in flat pattern, 0.05. BNP 654.8.    He feels weak but denies CP, orthopnea or PND.   He is to proceed with cardiac cath this am.    Past Medical History:  Diagnosis Date  . Acute cystitis with hematuria 06/11/2018  . Altered mental status 04/15/2016  . Anemia, chronic disease 07/17/2017  . At high risk for falls 12/24/2017  . Atherosclerotic heart disease of native coronary artery without angina pectoris 01/31/2016   1 stent 2006.  Neg ST 07/2011.  [place].  Overview:  PCI and DES to [facility] for unstable angina in 2006 Stress echo negative for ischemia at 5 mets Oct 2014 with EF 45-50% and inferior hypokinesia at rest Overview:  PCI and DES to [facility] for unstable angina in 2006  Stress echo negative for ischemia at 5 mets Oct 2014 with EF 45-50% and inferior hypokinesia at rest  . Benign prostatic hyperplasia 03/06/2016  . Corrosive esophagitis 03/06/2016   Overview:  Overview:  Schotskis ring  Overview:  Schotskis ring  . Dyslipidemia 01/31/2016  . Encephalopathy acute 06/11/2018  . High risk medication use 03/06/2016  . Hyperlipidemia 07/31/2011  . Hypertension 07/31/2011  . Hypertensive heart disease 01/31/2016  . Increased band cell count 04/15/2016  . Malaise and fatigue 12/24/2017  . Neurogenic bladder 03/08/2018   Follows up regularly with Dr. Debroah Baller  . Obstructive sleep apnea 07/31/2011  . Parkinson's disease (HCC) 08/03/2011   Choses not to see neurology  . Polyosteoarthritis 03/06/2016  . Prediabetes 03/09/2016  . Sepsis (HCC) 04/15/2016  . Sinus bradycardia 01/31/2016   [place].  . Urinary retention 06/11/2018  . Urinary tract infection 04/15/2016    Past Surgical History:  Procedure Laterality Date  . CATARACT EXTRACTION, BILATERAL    . CORONARY ANGIOPLASTY WITH STENT PLACEMENT    . INGUINAL HERNIA REPAIR    . KNEE SURGERY Bilateral   . PROSTATE SURGERY       Home Medications:  Prior to Admission medications   Medication Sig Start Date End Date Taking? Authorizing Provider  acetaminophen (TYLENOL) 325 MG tablet Take 325 mg by mouth every 6 (six) hours as needed (for pain).    Yes [provider]  aspirin  EC 81 MG tablet Take 81 mg by mouth daily.    Yes [provider]  finasteride (PROSCAR) 5 MG tablet Take 5 mg by mouth daily. 01/31/18  Yes [provider]  nitroGLYCERIN (NITROSTAT) 0.4 MG SL tablet Place 0.4 mg under the tongue every 5 (five) minutes x 3 doses as needed for chest pain.  03/07/17  Yes [provider]  rosuvastatin (CRESTOR) 10 MG tablet Take 1 tablet (10 mg total) by mouth once a week. Patient taking differently: Take 10 mg by mouth every Friday.  06/18/18  Yes Baldo Daub, MD  tamsulosin (FLOMAX) 0.4  MG CAPS capsule Take 0.4 mg by mouth daily. 01/31/18  Yes [provider]  vitamin B-12 (CYANOCOBALAMIN) 1000 MCG tablet Take 1,000 mcg by mouth daily.   Yes [provider]  carvedilol (COREG) 3.125 MG tablet Take 3.125 mg by mouth 2 (two) times daily.    [provider]    Inpatient Medications: Scheduled Meds: . aspirin EC  81 mg Oral Daily  . enoxaparin (LOVENOX) injection  40 mg Subcutaneous Q24H  . finasteride  5 mg Oral Daily  . [START ON 08/02/2018] rosuvastatin  10 mg Oral Q Fri  . sodium chloride flush  3 mL Intravenous Q12H   Continuous Infusions: . sodium chloride    . sodium chloride 10 mL/hr at 08/14/2018 0600   PRN Meds: sodium chloride, acetaminophen **OR** acetaminophen, ondansetron **OR** ondansetron (ZOFRAN) IV, sodium chloride flush  Allergies:    Allergies  Allergen Reactions  . Entresto [Sacubitril-Valsartan]     Seems to have worked too well.  Admitting hypotension, lightheadedness less than 24h after first dose.  . Gemfibrozil     Headaches  . Lisinopril Cough  . Niacin Other (See Comments)    "red flushing sensation"    Social History:   Social History   Socioeconomic History  . Marital status: Married    Spouse name: Not on file  . Number of children: Not on file  . Years of education: Not on file  . Highest education level: Not on file  Occupational History  . Not on file  Social Needs  . Financial resource strain: Not on file  . Food insecurity:    Worry: Not on file    Inability: Not on file  . Transportation needs:    Medical: Not on file    Non-medical: Not on file  Tobacco Use  . Smoking status: Never Smoker  . Smokeless tobacco: Never Used  Substance and Sexual Activity  . Alcohol use: Not Currently  . Drug use: Not Currently  . Sexual activity: Not on file  Lifestyle  . Physical activity:    Days per week: Not on file    Minutes per session: Not on file  . Stress: Not on file  Relationships  .  Social connections:    Talks on phone: Not on file    Gets together: Not on file    Attends religious service: Not on file    Active member of club or organization: Not on file    Attends meetings of clubs or organizations: Not on file    Relationship status: Not on file  . Intimate partner violence:    Fear of current or ex partner: Not on file    Emotionally abused: Not on file    Physically abused: Not on file    Forced sexual activity: Not on file  Other Topics Concern  . Not on file  Social History Narrative  . Not on file    Family History:    Family History  Problem Relation Age of Onset  . Heart Problems Mother   . Bradycardia Mother   . Colon cancer Father   . Cancer Sister      ROS:  Please see the history of present illness.   All other ROS reviewed and negative.     Physical Exam/Data:   Vitals:   08-14-2018 0045 14-Aug-2018 0200 08/14/18 0400 2018/08/14 0500  BP: (!) 91/47 (!) 91/51 (!) 84/49 (!) 87/47  Pulse: 64 70 (!) 51 (!) 59  Resp: 13  12 12   Temp:  97.8 F (36.6 C)  97.8 F (36.6 C)  TempSrc:  Oral  Oral  SpO2: 98% 98% 98% 94%  Weight:  60.1 kg    Height:  5\' 8"  (1.727 m)      Intake/Output Summary (Last 24 hours) at Aug 14, 2018 0658 Last data filed at Aug 14, 2018 0600 Gross per 24 hour  Intake 5501.53 ml  Output -  Net 5501.53 ml   Filed Weights   08/05/2018 2059 2018/08/14 0200  Weight: 59 kg 60.1 kg   Body mass index is 20.15 kg/m.    EKG:  The EKG was personally reviewed and demonstrates:  Sinus rhtyhm with PACs, non-specific ST changes   Relevant CV Studies:  Echo not in Epic- possibly done at [facility]  Laboratory Data:  Chemistry Recent Labs  Lab 08/04/2018 2105  NA 139  K 4.0  CL 106  CO2 25  GLUCOSE 108*  BUN 19  CREATININE 0.99  CALCIUM 8.7*  GFRNONAA >60  GFRAA >60  ANIONGAP 8    No results for input(s): PROT, ALBUMIN, AST, ALT, ALKPHOS, BILITOT in the last 168 hours. Hematology Recent Labs  Lab  08/20/2018 2105  WBC 4.7  RBC 3.38*  HGB 10.9*  HCT 34.3*  MCV 101.5*  MCH 32.2  MCHC 31.8  RDW 14.6  PLT 176   Cardiac Enzymes Recent Labs  Lab 07/30/2018 2105 2018/08/14 0058  TROPONINI 0.05* 0.05*   No results for input(s): TROPIPOC in the last 168 hours.  BNP Recent Labs  Lab 08/02/2018 2105  BNP 654.8*    DDimer No results for input(s): DDIMER in the last 168 hours.  Radiology/Studies:  No results found.  Assessment and Plan:   1) Hypotension  -Related to newly starting Entresto. IV fluids given.  2) Systolic heart failure -New EF 20-25% -Plan for R&L heart cath today by Dr. Gala Romney -To be followed by Advanced HF team with recommendation post cath.   3) CAD -On aspirin and statin -No anginal symptoms   For questions or updates, please contact [facility] Please consult [email or web address] for contact info under    Signed, Berton Bon, NP  14-Aug-2018 6:58 AM  Patient seen and examined with the above-signed Advanced Practice Provider and/or Housestaff. I personally reviewed laboratory data, imaging studies and relevant notes. I independently examined the patient and formulated the important aspects of the plan. I have edited the note to reflect any of my changes or salient points. I have personally discussed the plan with the patient and/or family.  84 y/o male with h/o CAD s/p remote LCX stent recently admitted to [facility] with acute systolic HF with newly decreased EF to 20-25%. Felt well on d/c and scheduled for outpatient cath today. Admitted last night with hypotension and profound weakness in setting of recent Entresto initiation. BP improved now. No CP. Will proceed with  R/L cath this am. On exam he is frail. No evidence of volume overload.   Arvilla Meres, MD  8:01 AM

## 2018-08-01 NOTE — H&P (Signed)
History and Physical    [name] [name]:[id number] DOB: 03-Apr-1934 DOA: 08/19/2018  PCP: Gordan Payment., MD  Patient coming from: Home  I have personally briefly reviewed patient's old medical records in [facility]  Chief Complaint: Near syncope  HPI: [name] [name] is a 84 y.o. male with medical history significant of HTN, CAD.  Patient was just admitted to [facility] 9/30 to 10/2 with SOB.  Found to have new onset CHF with EF 20-25%, BNP 4450, trop 0.07.  Patient treated with lasix IV 40mg , discharged home 10/2 with f/u LHC/RHC procedure already scheduled for 10/3 (later this morning) here at [facility].  Patient discharged on new meds entresto and coreg.  Coreg not started yet, took one dose of entresto.  Patient developed generalized weakness, lightheadedness, near syncope today after discharge and taking entresto.  EMS called and found patient to be hypotensive to 80s systolic.   ED Course: Remains 80s systolic in ED, 500cc NS by EMS and another 500cc bolus thus far in ED.  Normal mentation.  Kidney function looks normal at the moment, but symptomatic when trying to stand with these low BPs.  Lactic acid 1.1.   Review of Systems: As per HPI otherwise 10 point review of systems negative.   Past Medical History:  Diagnosis Date  . Acute cystitis with hematuria 06/11/2018  . Altered mental status 04/15/2016  . Anemia, chronic disease 07/17/2017  . At high risk for falls 12/24/2017  . Atherosclerotic heart disease of native coronary artery without angina pectoris 01/31/2016   1 stent 2006.  Neg ST 07/2011.  Munley.  Overview:  PCI and DES to [facility] for unstable angina in 2006 Stress echo negative for ischemia at 5 mets Oct 2014 with EF 45-50% and inferior hypokinesia at rest Overview:  PCI and DES to [facility] for unstable angina in 2006 Stress echo negative for ischemia at 5 mets Oct 2014 with EF 45-50% and inferior hypokinesia at rest  . Benign prostatic hyperplasia 03/06/2016  . Corrosive  esophagitis 03/06/2016   Overview:  Overview:  Schotskis ring  Overview:  Schotskis ring  . Dyslipidemia 01/31/2016  . Encephalopathy acute 06/11/2018  . High risk medication use 03/06/2016  . Hyperlipidemia 07/31/2011  . Hypertension 07/31/2011  . Hypertensive heart disease 01/31/2016  . Increased band cell count 04/15/2016  . Malaise and fatigue 12/24/2017  . Neurogenic bladder 03/08/2018   Follows up regularly with Dr. Debroah Baller  . Obstructive sleep apnea 07/31/2011  . Parkinson's disease (HCC) 08/03/2011   Choses not to see neurology  . Polyosteoarthritis 03/06/2016  . Prediabetes 03/09/2016  . Sepsis (HCC) 04/15/2016  . Sinus bradycardia 01/31/2016   Munley.  . Urinary retention 06/11/2018  . Urinary tract infection 04/15/2016    Past Surgical History:  Procedure Laterality Date  . CATARACT EXTRACTION, BILATERAL    . CORONARY ANGIOPLASTY WITH STENT PLACEMENT    . INGUINAL HERNIA REPAIR    . KNEE SURGERY Bilateral   . PROSTATE SURGERY       reports that he has never smoked. He has never used smokeless tobacco. He reports that he drank alcohol. He reports that he has current or past drug history.  Allergies  Allergen Reactions  . Entresto [Sacubitril-Valsartan]     Seems to have worked too well.  Admitting hypotension, lightheadedness less than 24h after first dose.  . Gemfibrozil     Headaches  . Lisinopril Cough  . Niacin Other (See Comments)    "red flushing  sensation"    Family History  Problem Relation Age of Onset  . Heart Problems Mother   . Bradycardia Mother   . Colon cancer Father   . Cancer Sister      Prior to Admission medications   Medication Sig Start Date End Date Taking? Authorizing Provider  acetaminophen (TYLENOL) 325 MG tablet Take 325 mg by mouth every 6 (six) hours as needed (for pain).    Yes [provider]  aspirin EC 81 MG tablet Take 81 mg by mouth daily.    Yes [provider]  finasteride (PROSCAR) 5 MG tablet Take 5 mg by mouth  daily. 01/31/18  Yes [provider]  nitroGLYCERIN (NITROSTAT) 0.4 MG SL tablet Place 0.4 mg under the tongue every 5 (five) minutes x 3 doses as needed for chest pain.  03/07/17  Yes [provider]  rosuvastatin (CRESTOR) 10 MG tablet Take 1 tablet (10 mg total) by mouth once a week. Patient taking differently: Take 10 mg by mouth every Friday.  06/18/18  Yes Baldo Daub, MD  tamsulosin (FLOMAX) 0.4 MG CAPS capsule Take 0.4 mg by mouth daily. 01/31/18  Yes [provider]  vitamin B-12 (CYANOCOBALAMIN) 1000 MCG tablet Take 1,000 mcg by mouth daily.   Yes [provider]  carvedilol (COREG) 3.125 MG tablet Take 3.125 mg by mouth 2 (two) times daily.    [provider]    Physical Exam: Vitals:   08/22/2018 2230 08/10/2018 2245 08/14/2018 2300 08/26/2018 2315  BP: (!) 90/45 (!) 94/50 (!) 89/59 (!) 84/52  Pulse: 65 66 68 64  Resp: 19 19 20 18   Temp:      TempSrc:      SpO2: 99% 99% 98% 97%  Weight:      Height:        Constitutional: NAD, calm, comfortable Eyes: PERRL, lids and conjunctivae normal ENMT: Mucous membranes are moist. Posterior pharynx clear of any exudate or lesions.Normal dentition.  Neck: normal, supple, no masses, no thyromegaly Respiratory: clear to auscultation bilaterally, no wheezing, no crackles. Normal respiratory effort. No accessory muscle use.  Cardiovascular: Regular rate and rhythm, no murmurs / rubs / gallops. No extremity edema. 2+ pedal pulses. No carotid bruits.  Abdomen: no tenderness, no masses palpated. No hepatosplenomegaly. Bowel sounds positive.  Musculoskeletal: no clubbing / cyanosis. No joint deformity upper and lower extremities. Good ROM, no contractures. Normal muscle tone.  Skin: no rashes, lesions, ulcers. No induration Neurologic: CN 2-12 grossly intact. Sensation intact, DTR normal. Strength 5/5 in all 4.  Psychiatric: Normal judgment and insight. Alert and oriented x 3. Normal mood.    Labs on  Admission: I have personally reviewed following labs and imaging studies  CBC: Recent Labs  Lab 08/07/2018 2105  WBC 4.7  HGB 10.9*  HCT 34.3*  MCV 101.5*  PLT 176   Basic Metabolic Panel: Recent Labs  Lab 08/12/2018 2105  NA 139  K 4.0  CL 106  CO2 25  GLUCOSE 108*  BUN 19  CREATININE 0.99  CALCIUM 8.7*   GFR: Estimated Creatinine Clearance: 48 mL/min (by C-G formula based on SCr of 0.99 mg/dL). Liver Function Tests: No results for input(s): AST, ALT, ALKPHOS, BILITOT, PROT, ALBUMIN in the last 168 hours. No results for input(s): LIPASE, AMYLASE in the last 168 hours. No results for input(s): AMMONIA in the last 168 hours. Coagulation Profile: No results for input(s): INR, PROTIME in the last 168 hours. Cardiac Enzymes: Recent Labs  Lab 08/21/2018  2105  TROPONINI 0.05*   BNP (last 3 results) No results for input(s): PROBNP in the last 8760 hours. HbA1C: No results for input(s): HGBA1C in the last 72 hours. CBG: Recent Labs  Lab 08/11/2018 2105  GLUCAP 86   Lipid Profile: No results for input(s): CHOL, HDL, LDLCALC, TRIG, CHOLHDL, LDLDIRECT in the last 72 hours. Thyroid Function Tests: No results for input(s): TSH, T4TOTAL, FREET4, T3FREE, THYROIDAB in the last 72 hours. Anemia Panel: No results for input(s): VITAMINB12, FOLATE, FERRITIN, TIBC, IRON, RETICCTPCT in the last 72 hours. Urine analysis:    Component Value Date/Time   COLORURINE YELLOW 07/06/2011 1130   APPEARANCEUR HAZY (A) 07/06/2011 1130   LABSPEC 1.021 07/06/2011 1130   PHURINE 6.0 07/06/2011 1130   GLUCOSEU NEGATIVE 07/06/2011 1130   HGBUR TRACE (A) 07/06/2011 1130   BILIRUBINUR NEGATIVE 07/06/2011 1130   KETONESUR NEGATIVE 07/06/2011 1130   PROTEINUR NEGATIVE 07/06/2011 1130   UROBILINOGEN 0.2 07/06/2011 1130   NITRITE NEGATIVE 07/06/2011 1130   LEUKOCYTESUR NEGATIVE 07/06/2011 1130    Radiological Exams on Admission: No results found.  EKG: Independently  reviewed.  Assessment/Plan Principal Problem:   Hypotension due to drugs Active Problems:   Coronary artery disease involving native coronary artery of native heart with angina pectoris (HCC)   Hypertensive heart disease   Chronic combined systolic (congestive) and diastolic (congestive) heart failure (HCC)    1. Hypotension - likely secondary to starting Entresto less than 24h ago 1. Stop entresto 2. Holding other BP meds 3. IVF: 1L so far NS, giving another 500cc bolus now 4. Tele monitor on SDU 5. Serial trops 6. Repeat BMP in AM 7. Will check a repeat lactic acid, though first lactate neg. 2. HTN / HTN heart disease - 1. Holding BP meds as above for the moment 3. CAD - 1. Continue ASA 2. Continue weekly statin 3. Cards to see in AM, actually due to new diagnosis of CHF with EF 20-25%, he is on the schedule for LHC / RHC at 730am this morning with Dr. Gala Romney 1. Have let cardiology on call know, they will let their AM team know so they can see him in hospital / let cath lab know that patient is admitted, etc. 2. Will keep patient NPO till cards sees him in anticipation of LHC. 4. CHF - 1. No acute symptoms of pulm edema nor SOB 2. Cards to see in AM as above 3. Watch for fluid overload with IVF  DVT prophylaxis: Lovenox Code Status: Full Family Communication: Family at bedside Disposition Plan: Home after admit Consults called: Spoke with cards on call as above Admission status: Place in [place], [place] M. DO [facility] Pager [phone] Only works nights!  If 7AM-7PM, please contact the primary day team physician taking care of patient  [email or web address] Password [email or web address]  08/22/2018, 12:34 AM

## 2018-08-01 NOTE — Plan of Care (Signed)

## 2018-08-01 NOTE — H&P (View-Only) (Signed)
Advanced HF Team Consultation:   Patient ID: [name] [name] MRN: [id number]; DOB: 07/04/1934  Admit date: 08/02/2018 Date of Consult: 08/14/2018  Primary Care Provider: Grisso, Greg A., MD Primary Cardiologist: Brian Munley, MD    Patient Profile:   [name] [name] is a 84 y.o. male with a hx of  CAD with PCI and DES to [facility] in 2006, EF 45-50%, hypertension parkinson's disease and dyslipidemia who is being seen today for the evaluation acute systolic HF and hypotension after taking Entresto at the request of Dr. Pokhrel.  History of Present Illness:   [name] is a 84 y.o. male with a hx of CAD with PCI and DES to [facility] in 2006, EF 45-50%, hypertension parkinson's disease and dyslipidemia.  Pt was recently found to have new decreased EF 20-25% after being admitted to [place] [facility] for shortness of breath and weakness.  He was diuresed and started on new HF meds Entresto and Coreg. He had not started croeg yet but had one dose of Entresto. He is scheduled to have R&L heart cath today. He presented to the hospital last night with generalized weakness, lightheadedness, near syncope today after taking entresto.  EMS called and found patient to be hypotensive to 80s systolic.He has been given IV fluids. Troponins mildly elevated in flat pattern, 0.05. BNP 654.8.    He feels weak but denies CP, orthopnea or PND.   He is to proceed with cardiac cath this am.    Past Medical History:  Diagnosis Date  . Acute cystitis with hematuria 06/11/2018  . Altered mental status 04/15/2016  . Anemia, chronic disease 07/17/2017  . At high risk for falls 12/24/2017  . Atherosclerotic heart disease of native coronary artery without angina pectoris 01/31/2016   1 stent 2006.  Neg ST 07/2011.  [place].  Overview:  PCI and DES to [facility] for unstable angina in 2006 Stress echo negative for ischemia at 5 mets Oct 2014 with EF 45-50% and inferior hypokinesia at rest Overview:  PCI and DES to [facility] for unstable angina in 2006  Stress echo negative for ischemia at 5 mets Oct 2014 with EF 45-50% and inferior hypokinesia at rest  . Benign prostatic hyperplasia 03/06/2016  . Corrosive esophagitis 03/06/2016   Overview:  Overview:  Schotskis ring  Overview:  Schotskis ring  . Dyslipidemia 01/31/2016  . Encephalopathy acute 06/11/2018  . High risk medication use 03/06/2016  . Hyperlipidemia 07/31/2011  . Hypertension 07/31/2011  . Hypertensive heart disease 01/31/2016  . Increased band cell count 04/15/2016  . Malaise and fatigue 12/24/2017  . Neurogenic bladder 03/08/2018   Follows up regularly with Dr. Roberto Chao  . Obstructive sleep apnea 07/31/2011  . Parkinson's disease (HCC) 08/03/2011   Choses not to see neurology  . Polyosteoarthritis 03/06/2016  . Prediabetes 03/09/2016  . Sepsis (HCC) 04/15/2016  . Sinus bradycardia 01/31/2016   Munley.  . Urinary retention 06/11/2018  . Urinary tract infection 04/15/2016    Past Surgical History:  Procedure Laterality Date  . CATARACT EXTRACTION, BILATERAL    . CORONARY ANGIOPLASTY WITH STENT PLACEMENT    . INGUINAL HERNIA REPAIR    . KNEE SURGERY Bilateral   . PROSTATE SURGERY       Home Medications:  Prior to Admission medications   Medication Sig Start Date End Date Taking? Authorizing Provider  acetaminophen (TYLENOL) 325 MG tablet Take 325 mg by mouth every 6 (six) hours as needed (for pain).    Yes [provider]  aspirin   EC 81 MG tablet Take 81 mg by mouth daily.    Yes [provider]  finasteride (PROSCAR) 5 MG tablet Take 5 mg by mouth daily. 01/31/18  Yes [provider]  nitroGLYCERIN (NITROSTAT) 0.4 MG SL tablet Place 0.4 mg under the tongue every 5 (five) minutes x 3 doses as needed for chest pain.  03/07/17  Yes [provider]  rosuvastatin (CRESTOR) 10 MG tablet Take 1 tablet (10 mg total) by mouth once a week. Patient taking differently: Take 10 mg by mouth every Friday.  06/18/18  Yes Munley, Brian J, MD  tamsulosin (FLOMAX) 0.4  MG CAPS capsule Take 0.4 mg by mouth daily. 01/31/18  Yes [provider]  vitamin B-12 (CYANOCOBALAMIN) 1000 MCG tablet Take 1,000 mcg by mouth daily.   Yes [provider]  carvedilol (COREG) 3.125 MG tablet Take 3.125 mg by mouth 2 (two) times daily.    [provider]    Inpatient Medications: Scheduled Meds: . aspirin EC  81 mg Oral Daily  . enoxaparin (LOVENOX) injection  40 mg Subcutaneous Q24H  . finasteride  5 mg Oral Daily  . [START ON 08/02/2018] rosuvastatin  10 mg Oral Q Fri  . sodium chloride flush  3 mL Intravenous Q12H   Continuous Infusions: . sodium chloride    . sodium chloride 10 mL/hr at 08/26/2018 0600   PRN Meds: sodium chloride, acetaminophen **OR** acetaminophen, ondansetron **OR** ondansetron (ZOFRAN) IV, sodium chloride flush  Allergies:    Allergies  Allergen Reactions  . Entresto [Sacubitril-Valsartan]     Seems to have worked too well.  Admitting hypotension, lightheadedness less than 24h after first dose.  . Gemfibrozil     Headaches  . Lisinopril Cough  . Niacin Other (See Comments)    "red flushing sensation"    Social History:   Social History   Socioeconomic History  . Marital status: Married    Spouse name: Not on file  . Number of children: Not on file  . Years of education: Not on file  . Highest education level: Not on file  Occupational History  . Not on file  Social Needs  . Financial resource strain: Not on file  . Food insecurity:    Worry: Not on file    Inability: Not on file  . Transportation needs:    Medical: Not on file    Non-medical: Not on file  Tobacco Use  . Smoking status: Never Smoker  . Smokeless tobacco: Never Used  Substance and Sexual Activity  . Alcohol use: Not Currently  . Drug use: Not Currently  . Sexual activity: Not on file  Lifestyle  . Physical activity:    Days per week: Not on file    Minutes per session: Not on file  . Stress: Not on file  Relationships  .  Social connections:    Talks on phone: Not on file    Gets together: Not on file    Attends religious service: Not on file    Active member of club or organization: Not on file    Attends meetings of clubs or organizations: Not on file    Relationship status: Not on file  . Intimate partner violence:    Fear of current or ex partner: Not on file    Emotionally abused: Not on file    Physically abused: Not on file    Forced sexual activity: Not on file  Other Topics Concern  . Not on file    Social History Narrative  . Not on file    Family History:    Family History  Problem Relation Age of Onset  . Heart Problems Mother   . Bradycardia Mother   . Colon cancer Father   . Cancer Sister      ROS:  Please see the history of present illness.   All other ROS reviewed and negative.     Physical Exam/Data:   Vitals:   08/16/2018 0045 08/22/2018 0200 08/25/2018 0400 08/10/2018 0500  BP: (!) 91/47 (!) 91/51 (!) 84/49 (!) 87/47  Pulse: 64 70 (!) 51 (!) 59  Resp: 13  12 12  Temp:  97.8 F (36.6 C)  97.8 F (36.6 C)  TempSrc:  Oral  Oral  SpO2: 98% 98% 98% 94%  Weight:  60.1 kg    Height:  5' 8" (1.727 m)      Intake/Output Summary (Last 24 hours) at 08/28/2018 0658 Last data filed at 08/21/2018 0600 Gross per 24 hour  Intake 5501.53 ml  Output -  Net 5501.53 ml   Filed Weights   08/18/2018 2059 08/06/2018 0200  Weight: 59 kg 60.1 kg   Body mass index is 20.15 kg/m.    EKG:  The EKG was personally reviewed and demonstrates:  Sinus rhtyhm with PACs, non-specific ST changes   Relevant CV Studies:  Echo not in Epic- possibly done at [place] [facility]  Laboratory Data:  Chemistry Recent Labs  Lab 08/08/2018 2105  NA 139  K 4.0  CL 106  CO2 25  GLUCOSE 108*  BUN 19  CREATININE 0.99  CALCIUM 8.7*  GFRNONAA >60  GFRAA >60  ANIONGAP 8    No results for input(s): PROT, ALBUMIN, AST, ALT, ALKPHOS, BILITOT in the last 168 hours. Hematology Recent Labs  Lab  08/14/2018 2105  WBC 4.7  RBC 3.38*  HGB 10.9*  HCT 34.3*  MCV 101.5*  MCH 32.2  MCHC 31.8  RDW 14.6  PLT 176   Cardiac Enzymes Recent Labs  Lab 08/28/2018 2105 08/23/2018 0058  TROPONINI 0.05* 0.05*   No results for input(s): TROPIPOC in the last 168 hours.  BNP Recent Labs  Lab 08/20/2018 2105  BNP 654.8*    DDimer No results for input(s): DDIMER in the last 168 hours.  Radiology/Studies:  No results found.  Assessment and Plan:   1) Hypotension  -Related to newly starting Entresto. IV fluids given.  2) Systolic heart failure -New EF 20-25% -Plan for R&L heart cath today by Dr. Marvetta Vohs -To be followed by Advanced HF team with recommendation post cath.   3) CAD -On aspirin and statin -No anginal symptoms   For questions or updates, please contact [facility] Please consult [email or web address] for contact info under    Signed, Janine Hammond, NP  08/29/2018 6:58 AM  Patient seen and examined with the above-signed Advanced Practice Provider and/or Housestaff. I personally reviewed laboratory data, imaging studies and relevant notes. I independently examined the patient and formulated the important aspects of the plan. I have edited the note to reflect any of my changes or salient points. I have personally discussed the plan with the patient and/or family.  84 y/o male with h/o CAD s/p remote LCX stent recently admitted to [facility] with acute systolic HF with newly decreased EF to 20-25%. Felt well on d/c and scheduled for outpatient cath today. Admitted last night with hypotension and profound weakness in setting of recent Entresto initiation. BP improved now. No CP. Will proceed with   R/L cath this am. On exam he is frail. No evidence of volume overload.   Aaro Meyers, MD  8:01 AM   

## 2018-08-01 NOTE — Progress Notes (Addendum)
PROGRESS NOTE  Same day note  [name] [name]  MVH:[id number] DOB: [birth year] DOA: 08/02/2018 PCP: Gordan Payment., MD   Brief Narrative: Patient is a 84 years old male with past medical history significant for hypertension, coronary artery disease was recently admitted to [facility] between 07/29/2018 to 10/2//2019 with complaints of shortness of breath.  He was diagnosed to have new onset congestive heart failure with ejection fraction 20 to 25%.  He was treated with Lasix and was discharged home to follow-up with left heart catheterization scheduled for 08/04/2018 at [facility] hospital.  Patient was discharged on Entresto and Coreg and the patient had taken 1 dose of Entresto.  After that he developed generalized weakness lightheadedness and near syncope and was hypotensive with 80s systolic in the ED.  In the ED, patient received normal saline and was admitted to the hospital for further observation.  Assessment & Plan:   Principal Problem:   Hypotension due to drugs Active Problems:   Coronary artery disease involving native coronary artery of native heart with angina pectoris (HCC)   Hypertensive heart disease   Chronic combined systolic (congestive) and diastolic (congestive) heart failure (HCC)  Chronic combined systolic and diastolic congestive heart failure with acute hypotension likely secondary to medication.  Patient received IV fluids.  We will closely monitor for fluid overload.  History of coronary artery disease status post PCI with placement of drug-eluting stent to LCx in 2006.  Recent reduction in his LV ejection fraction.  Patient is a scheduled for cardiac catheterization today.  Continue aspirin  Hypertension.  Blood pressure medications on hold for now.  We will closely monitor blood pressure.  Patient was on Coreg at home.  Hyperlipidemia.  Continue statins,  Parkinson's disease.  Continue supportive care  We will change the patient's status to inpatient at this time  due to need for cardiac catheterization and further work-up and closer monitoring.  DVT prophylaxis: Lovenox  Code Status: Full code  Family Communication: No family members present at bedside  Disposition Plan: Home  Consultants: Cardiology  Procedures:   Antimicrobials:   Subjective: Patient denies chest pain palpitation shortness of breath.  Denies any dizziness lightheadedness   Objective: Vitals:   08/19/2018 0830 08/20/2018 0845 08/05/2018 1000 08/14/2018 1142  BP: (!) 92/51  (!) 89/51 (!) 90/46  Pulse: 65 (!) 0 (!) 59 (!) 52  Resp: 18 (!) 0 19 11  Temp:    97.6 F (36.4 C)  TempSrc:    Oral  SpO2: 100% (!) 0% 100% 100%  Weight:      Height:        Intake/Output Summary (Last 24 hours) at 08/08/2018 1318 Last data filed at 08/19/2018 1000 Gross per 24 hour  Intake 6930.11 ml  Output -  Net 6930.11 ml   Filed Weights   08/06/2018 2059 08/02/2018 0200  Weight: 59 kg 60.1 kg    Examination: General exam: Appears mildly agitated wants to get out of the bed.,Not in obvious distress, thinly built HEENT:PERRL,Oral mucosa moist, Ear/Nose normal on gross exam Respiratory system: Bilateral equal air entry, normal vesicular breath sounds, no wheezes or crackles  Cardiovascular system: S1 & S2 heard, RRR. No JVD, murmurs, rubs, gallops or clicks. No pedal edema. Gastrointestinal system: Abdomen is nondistended, soft and nontender. No organomegaly or masses felt. Normal bowel sounds heard. MSK: Normal muscle bulk,tone ,power Extremities: No edema, no clubbing ,no cyanosis, distal peripheral pulses palpable. Skin: No rashes, lesions or ulcers,no icterus ,no pallor Central nervous  system: Alert awake and communicative, no focal neurological deficits.  Data Reviewed: I have personally reviewed following labs and imaging studies  CBC: Recent Labs  Lab 08/14/2018 2105 08/29/2018 0646  WBC 4.7 4.0  HGB 10.9* 10.3*  HCT 34.3* 32.1*  MCV 101.5* 100.9*  PLT 176 157   Basic  Metabolic Panel: Recent Labs  Lab 08/28/2018 2105 08/18/2018 0646  NA 139 140  K 4.0 3.5  CL 106 110  CO2 25 22  GLUCOSE 108* 99  BUN 19 13  CREATININE 0.99 0.73  CALCIUM 8.7* 8.3*   GFR: Estimated Creatinine Clearance: 60.5 mL/min (by C-G formula based on SCr of 0.73 mg/dL). Liver Function Tests: No results for input(s): AST, ALT, ALKPHOS, BILITOT, PROT, ALBUMIN in the last 168 hours. No results for input(s): LIPASE, AMYLASE in the last 168 hours. No results for input(s): AMMONIA in the last 168 hours. Coagulation Profile: No results for input(s): INR, PROTIME in the last 168 hours. Cardiac Enzymes: Recent Labs  Lab 08/17/2018 2105 08/08/2018 0058 08/13/2018 0646  TROPONINI 0.05* 0.05* 0.04*   BNP (last 3 results) No results for input(s): PROBNP in the last 8760 hours. HbA1C: No results for input(s): HGBA1C in the last 72 hours. CBG: Recent Labs  Lab 08/04/2018 2105  GLUCAP 86   Lipid Profile: No results for input(s): CHOL, HDL, LDLCALC, TRIG, CHOLHDL, LDLDIRECT in the last 72 hours. Thyroid Function Tests: No results for input(s): TSH, T4TOTAL, FREET4, T3FREE, THYROIDAB in the last 72 hours. Anemia Panel: No results for input(s): VITAMINB12, FOLATE, FERRITIN, TIBC, IRON, RETICCTPCT in the last 72 hours. Sepsis Labs: Recent Labs  Lab 08/07/2018 2115 08/27/2018 0058  LATICACIDVEN 1.12 0.8    Recent Results (from the past 240 hour(s))  MRSA PCR Screening     Status: None   Collection Time: 08/09/2018  3:39 AM  Result Value Ref Range Status   MRSA by PCR NEGATIVE NEGATIVE Final    Comment:        The GeneXpert MRSA Assay (FDA approved for NASAL specimens only), is one component of a comprehensive MRSA colonization surveillance program. It is not intended to diagnose MRSA infection nor to guide or monitor treatment for MRSA infections. Performed at [facility], [address], [place], [place] [address]      Radiology Studies: No results found.  Scheduled  Meds: . aspirin EC  81 mg Oral Daily  . [START ON 08/02/2018] enoxaparin (LOVENOX) injection  40 mg Subcutaneous Q24H  . finasteride  5 mg Oral Daily  . [START ON 08/02/2018] rosuvastatin  10 mg Oral Q Fri  . sodium chloride flush  3 mL Intravenous Q12H   Continuous Infusions: . sodium chloride 75 mL/hr at 08/25/2018 1031  . sodium chloride       LOS: 0 days   Time spent: More than 50% of that time was spent in counseling and/or coordination of care.  Joycelyn Das, MD [facility] Pager [phone]  If 7PM-7AM, please contact night-coverage [email or web address] Password [email or web address] 08/10/2018, 1:18 PM

## 2018-08-01 NOTE — Interval H&P Note (Signed)
History and Physical Interval Note:  08/09/2018 8:02 AM  [name] [name]  has presented today for surgery, with the diagnosis of chf  The various methods of treatment have been discussed with the patient and family. After consideration of risks, benefits and other options for treatment, the patient has consented to  Procedure(s): RIGHT/LEFT HEART CATH AND CORONARY ANGIOGRAPHY (N/A) and possible coronary angioplasty as a surgical intervention .  The patient's history has been reviewed, patient examined, no change in status, stable for surgery.  I have reviewed the patient's chart and labs.  Questions were answered to the patient's satisfaction.     Abdelaziz Westenberger

## 2018-08-02 ENCOUNTER — Inpatient Hospital Stay (HOSPITAL_COMMUNITY): Payer: Medicare Other

## 2018-08-02 DIAGNOSIS — I5021 Acute systolic (congestive) heart failure: Secondary | ICD-10-CM

## 2018-08-02 LAB — CBC
HCT: 40.1 % (ref 39.0–52.0)
Hemoglobin: 13.2 g/dL (ref 13.0–17.0)
MCH: 32.9 pg (ref 26.0–34.0)
MCHC: 32.9 g/dL (ref 30.0–36.0)
MCV: 100 fL (ref 78.0–100.0)
PLATELETS: 202 10*3/uL (ref 150–400)
RBC: 4.01 MIL/uL — ABNORMAL LOW (ref 4.22–5.81)
RDW: 14.2 % (ref 11.5–15.5)
WBC: 10.2 10*3/uL (ref 4.0–10.5)

## 2018-08-02 LAB — BLOOD GAS, ARTERIAL
Acid-base deficit: 1 mmol/L (ref 0.0–2.0)
Bicarbonate: 23 mmol/L (ref 20.0–28.0)
Delivery systems: POSITIVE
Drawn by: 35043
Expiratory PAP: 8
FIO2: 50
Inspiratory PAP: 16
O2 Saturation: 97.2 %
Patient temperature: 97.1
pCO2 arterial: 35.4 mmHg (ref 32.0–48.0)
pH, Arterial: 7.425 (ref 7.350–7.450)
pO2, Arterial: 94.3 mmHg (ref 83.0–108.0)

## 2018-08-02 LAB — BASIC METABOLIC PANEL
ANION GAP: 10 (ref 5–15)
BUN: 16 mg/dL (ref 8–23)
CALCIUM: 8.8 mg/dL — AB (ref 8.9–10.3)
CO2: 20 mmol/L — ABNORMAL LOW (ref 22–32)
Chloride: 110 mmol/L (ref 98–111)
Creatinine, Ser: 1.02 mg/dL (ref 0.61–1.24)
GLUCOSE: 133 mg/dL — AB (ref 70–99)
Potassium: 3.8 mmol/L (ref 3.5–5.1)
Sodium: 140 mmol/L (ref 135–145)

## 2018-08-02 LAB — GLUCOSE, CAPILLARY: GLUCOSE-CAPILLARY: 156 mg/dL — AB (ref 70–99)

## 2018-08-02 MED ORDER — FUROSEMIDE 10 MG/ML IJ SOLN
40.0000 mg | Freq: Once | INTRAMUSCULAR | Status: AC
  Administered 2018-08-02: 40 mg via INTRAVENOUS
  Filled 2018-08-02: qty 4

## 2018-08-02 MED ORDER — DIGOXIN 125 MCG PO TABS
0.1250 mg | ORAL_TABLET | Freq: Every day | ORAL | Status: DC
  Administered 2018-08-02 – 2018-08-03 (×2): 0.125 mg via ORAL
  Filled 2018-08-02 (×2): qty 1

## 2018-08-02 NOTE — Progress Notes (Signed)
Pt came down to MRI and was pretty much slow to respond, got pt in the scanner and he started yelling he had to get out.  Pt only let me perform 2 non contrasted series.  Dr Shirlee Latch texted and informed, but pt refused further imaging.  Pt returned to the floor.  Non contrasted pictures turned in.

## 2018-08-02 NOTE — Progress Notes (Signed)
Rapid response  Called to assess patient ,deep suctioning done , productive of  Light red fluid , Patient  became less crepitus breath sounds . Continues on rebreather , care  And observation continues.

## 2018-08-02 NOTE — Progress Notes (Signed)
Patient is stable in bed , breathing comfortably on  Bipap, vital signs have stabilized Plan   of care is to continue on bipap with palliative care .

## 2018-08-02 NOTE — Progress Notes (Addendum)
Advanced Heart Failure Rounding Note  PCP-Cardiologist: Norman Herrlich, MD   Subjective:     Admitted with hypotension after starting entresto. TCTS consulted. No plan for CABG.   Over night he was hypoxic and was placed on bipap and received 40 mg IV lasix. Bipap later weaned off to 3 liters nasal cannula.   Denies SOB/CP.   RHC/LHC 08/02/2018   Ost RCA to Prox RCA lesion is 70% stenosed.  Ost RPDA to RPDA lesion is 100% stenosed.  Mid RCA lesion is 40% stenosed.  Previously placed Prox Cx to Mid Cx stent (unknown type) is widely patent.  Ost Cx lesion is 40% stenosed.  Prox LAD lesion is 99% stenosed.  Ost 1st Diag lesion is 99% stenosed.  Prox LAD to Dist LAD lesion is 40% stenosed.  Ost LM lesion is 40% stenosed.  Findings: Ao = 82/41 (56)  LV = 78/16 RA = 1 RV = 22/2 PA = 23/3 (12) PCW = 9 Fick cardiac output/index = 5.7/3.3 PVR = 0.5 WU Ao sat = 98% PA sat = 69%, 72% Assessment: 1. 3v CAD with subtotally occluded proximal LAD with TIMI 1-2 flow distally. Lesion is heavily calcified. 2. Ostial 70% RCA with totally occluded ostial RPDA with left to right collaterals 3. Patent LCX stent 4. Ischemic CM LVEF 20-25%   Objective:   Weight Range: 60.1 kg Body mass index is 20.15 kg/m.   Vital Signs:   Temp:  [97.1 F (36.2 C)-98.4 F (36.9 C)] 97.8 F (36.6 C) (10/04 0800) Pulse Rate:  [52-118] 100 (10/04 0800) Resp:  [11-41] 22 (10/04 0800) BP: (71-163)/(45-106) 104/60 (10/04 0800) SpO2:  [92 %-100 %] 99 % (10/04 0800)    Weight change: Filed Weights   August 21, 2018 2059 08/12/2018 0200  Weight: 59 kg 60.1 kg    Intake/Output:   Intake/Output Summary (Last 24 hours) at 08/02/2018 0901 Last data filed at 08/02/2018 0700 Gross per 24 hour  Intake 1428.58 ml  Output 825 ml  Net 603.58 ml      Physical Exam    General:  Appears frail. No resp difficulty HEENT: Normal Neck: Supple. JVP flat  . Carotids 2+ bilat; no bruits. No  lymphadenopathy or thyromegaly appreciated. Cor: PMI nondisplaced. Regular rate & rhythm. No rubs, gallops or murmurs. Lungs: Clear on 3 liters  Abdomen: Soft, nontender, nondistended. No hepatosplenomegaly. No bruits or masses. Good bowel sounds. Extremities: No cyanosis, clubbing, rash, edema Neuro: Alert & orientedx3, cranial nerves grossly intact. moves all 4 extremities w/o difficulty. Affect flat. Tremors.    Telemetry  NSR 80-100s    EKG    N/a   Labs    CBC Recent Labs    08/29/2018 0646 08/02/18 0558  WBC 4.0 10.2  HGB 10.3* 13.2  HCT 32.1* 40.1  MCV 100.9* 100.0  PLT 157 202   Basic Metabolic Panel Recent Labs    16/10/96 0646 08/02/18 0324  NA 140 140  K 3.5 3.8  CL 110 110  CO2 22 20*  GLUCOSE 99 133*  BUN 13 16  CREATININE 0.73 1.02  CALCIUM 8.3* 8.8*   Liver Function Tests No results for input(s): AST, ALT, ALKPHOS, BILITOT, PROT, ALBUMIN in the last 72 hours. No results for input(s): LIPASE, AMYLASE in the last 72 hours. Cardiac Enzymes Recent Labs    21-Aug-2018 2105 08/20/2018 0058 08/07/2018 0646  TROPONINI 0.05* 0.05* 0.04*    BNP: BNP (last 3 results) Recent Labs    08/21/2018 2105  BNP 654.8*  ProBNP (last 3 results) No results for input(s): PROBNP in the last 8760 hours.   D-Dimer No results for input(s): DDIMER in the last 72 hours. Hemoglobin A1C No results for input(s): HGBA1C in the last 72 hours. Fasting Lipid Panel No results for input(s): CHOL, HDL, LDLCALC, TRIG, CHOLHDL, LDLDIRECT in the last 72 hours. Thyroid Function Tests No results for input(s): TSH, T4TOTAL, T3FREE, THYROIDAB in the last 72 hours.  Invalid input(s): FREET3  Other results:   Imaging    Dg Chest Port 1 View  Result Date: 08/02/2018 CLINICAL DATA:  Lung crackles EXAM: PORTABLE CHEST 1 VIEW COMPARISON:  07/29/2017 FINDINGS: Increased interstitial markings, right upper lobe predominant, favoring asymmetric interstitial edema. Multifocal  infection is also possible. This appearance has mildly progressed. Possible small left pleural effusion.  No pneumothorax. Cardiomegaly. IMPRESSION: Cardiomegaly with increased interstitial markings, right upper lobe predominant, favoring asymmetric interstitial edema. Multifocal infection is also possible. This appearance has mildly progressed when compared to the prior. Electronically Signed   By: Charline Bills M.D.   On: 08/02/2018 01:45      Medications:     Scheduled Medications: . aspirin EC  81 mg Oral Daily  . enoxaparin (LOVENOX) injection  40 mg Subcutaneous Q24H  . finasteride  5 mg Oral Daily  . rosuvastatin  10 mg Oral Q Fri  . sodium chloride flush  3 mL Intravenous Q12H     Infusions: . sodium chloride       PRN Medications:  sodium chloride, acetaminophen, ondansetron (ZOFRAN) IV, ondansetron **OR** [DISCONTINUED] ondansetron (ZOFRAN) IV, sodium chloride flush    Patient Profile   [name] [name] is a 84 y.o. male with a hx of  CAD with PCI and DES to [facility] in 2006, EF 45-50%, hypertension parkinson's disease and dyslipidemia who is being seen today for the evaluation acute systolic HF and hypotension after taking Entresto at the request of Dr. Tyson Babinski.  Assessment/Plan   1) Hypotension  -Related to newly starting Entresto. - Improved.    2) Acute Hypoxic Respiratory Failure Had Bipap over night. Sats dropped 60-70. CXR with edema.  Improved and Bipap was stopped.Now on 3 liters oxygen.   3)  Systolic heart failure -New EF 20-25% - ICM. Had cath yesterday. Not a candidate for CABG.  - Hold off on diuretics.  - Add 0.125 mg digoxin today. No bb for now.  No entresto, spiro, or lasix for now.  - Renal remains normal but trending up 0.7>1.0.  -Check BMET in am.   4) CAD LHC with multivessel disease -No s/s ischemia.  -On aspirin and statin  5) Parkinsons Disease  Palliative Care consulted for goals of care. PT consulted. Suspect he will need  SNF>     Length of Stay: 1  Amy Clegg, NP  08/02/2018, 9:01 AM  Advanced Heart Failure Team Pager [phone] (M-F; 7a - 4p)  Please contact [facility] for night-coverage after hours (4p -7a ) and weekends on [email or web address]  Patient seen and examined with Tonye Becket, NP. We discussed all aspects of the encounter. I agree with the assessment and plan as stated above.   Events of last night noted and discussed with patient and family.Onc ath yesterday he was quite dry. I was hopeful he would perk up with IVF but even with a small amount of fluid he developed severe respiratory failure. Today he is very weak and continues to decline. He is not candidate for further aggressive care. We will work to optimize his  volume status but I told him and his family that the only real option is for Hospice Care. Given his sever parkinsons disease and cachexia in addition to his wife's dementia would recommend residential hospital.   Arvilla Meres, MD  1:24 PM

## 2018-08-02 NOTE — Progress Notes (Addendum)
RT called to 2C14.  Patient had been placed on NRB earlier but was still having low sats, increased WOB, and diaphoretic.  Rapid Response had informed RT that providers wanted patient to be put on BIPAP.  Patient was placed on BIPAP 10/5 at 40%, but improvement was still needed.  RT placed patient on 16/8 at 50%, patient responded and sats increased, WOB decreased, and  Breath sounds improved.  Patient is resting comfortably and is being monitored by Coralyn Helling, RN.

## 2018-08-02 NOTE — Significant Event (Addendum)
Rapid Response Event Note Called by staff for increased WOB   Overview: Time Called: 0200 Arrival Time: 0205 Event Type: Cardiac, Respiratory  Initial Focused Assessment: Upon arrival, [name] [name] is lethargic and arousable to voice.  Oriented to self.  [name] [name] is in significant respiratory distress with RR 36-40s, with retractions and abdominal accessory muscle use.  His skin is warm, pink and diaphoretic.  BBS coarse rhonchi and rales, diminished in the bases.  HR 120 ST, BP 163/106, RR 32 on NRB at 15L with Sats 93%.  Staff notified Merdis Delay NP for earlier orders PCXR and Lasix.  Pt has not responded to the lasix.  NTS x1 with good result, pink thin liquid secretions.  Notified K. Schorr of events and she is coming to the bedside.  Interventions: -NTS -Bipap -2nd PIV  Plan of Care (if not transferred): -Continue to monitor -May escalate level of care if pressors needed for BP support -Notify primary svc and/or RRRN for any further clinical decline  Event Summary: Merdis Delay NP is attempting to reach [facility] via telephone to clarify code status and GOC. HR 84 NSR with PAC/PVC, BP 93/61, RR 25 with sats 98% on Bipap 16/8 and 50% FiO2.  Call ended [phone]  Rose Fillers

## 2018-08-02 NOTE — Care Management (Addendum)
2:00pm Update:  Pt/family now interested in residential hospice not home with hospice.  CSW consulted  1:25pm  CM received consult for home with hospice will tentatively discharge sometime this weekend.  CM offered agency choice to both pt and wife - wife chose [facility] - CM made referral to [employer] - pending referral accepted.  Liason to meet pt/family at bedside today around 3:30pm.  Pt will likely need hospital bed and oxygen in the home and PTAR transport home

## 2018-08-02 NOTE — Progress Notes (Signed)
Patient noted to be desatting in the 60s with a non productive cough , still trying to get out of bed , O2 commenced via nasal cannula , then self re-breather , started increasing with frebreather mask O2 @ 15 L/min, Dr on call paged ,orders given for iv  lasix and CXR , same done awaiting further management . Patient remains on O2 with harsh breath sounds .

## 2018-08-02 NOTE — Progress Notes (Addendum)
PROGRESS NOTE  Same day note  [name] [name]  ZOX:[id number] DOB: 1934/04/08 DOA: 08-27-2018 PCP: Gordan Payment., MD   Brief Narrative: Patient is a 84 years old male with past medical history significant for hypertension, coronary artery disease was recently admitted to [facility] between 07/29/2018 to 10/2//2019 with complaints of shortness of breath.  He was diagnosed to have new onset congestive heart failure with ejection fraction 20 to 25%.  He was treated with Lasix and was discharged home to follow-up with left heart catheterization scheduled for 08/26/2018 at [facility] hospital.  Patient was discharged on Entresto and Coreg and the patient had taken 1 dose of Entresto.  After that, he developed generalized weakness, lightheadedness and near syncope and was hypotensive with 80s systolic and presented to the ED in the evening before the scheduled cath on 08/10/2018. In the ED, patient received boluses of normal saline and was admitted to the hospital for further observation and evaluation..  Assessment & Plan:   Principal Problem:   Hypotension due to drugs Active Problems:   Coronary artery disease involving native coronary artery of native heart with angina pectoris (HCC)   Hypertensive heart disease   Chronic combined systolic (congestive) and diastolic (congestive) heart failure (HCC)  Respiratory status overnight likely secondary to fluid overload from IV fluid replacement.  This has improved after BiPAP and Lasix.  Has significantly low ejection fraction.  Heart failure team on board.  He on nasal cannula 3 L/min..  Chronic combined systolic and diastolic congestive heart failure with acute hypotension likely secondary to medication. Hypotension resolved with  IV fluids and holding medication.  Team and cardiology on board.  Is been initiated on digoxin.  Continue Crestor.  History of coronary artery disease status post PCI with placement of drug-eluting stent to LCx in 2006.   Cardiac catheterization on 08/14/2018 showed multivessel disease not amenable to surgery.  Cardio thoracic surgery recommended medical treatment.  Hypertension.  Blood pressure medications on hold for now.  We will closely monitor blood pressure.  Patient was on Coreg at home.  Hyperlipidemia.  Continue statins,   Parkinson's disease.  Continue supportive care.  Weight loss.  Failure to thrive.  Present on admission.  Likely secondary to cardiac cachexia.  Poor prognosis.  Nutrition consult.  Debility, weakness.  Will get physical therapy evaluation.  Disposition.  I spoke with the team.  I also spoke with the patient's daughter on the phone and she stated that her mother was with her and she could not pick up the phone yesterday.  Plan about the possible need for skilled nursing facility placement/palliative care and the necessity of defining the goals of care.  C and her mother is coming to the hospital today.   DVT prophylaxis: Lovenox  Code Status: Full code  Family Communication: Spoke with the patient's daughter on the phone and updated her about the clinical condition of the patient.  Disposition Plan: Home  Consultants: Cardiology, cardio thoracic surgery, heart failure team, relative care  Procedures: Cardiac catheterization on 08/02/2018.  Antimicrobials: None  Subjective: Patient had a rapid response yesterday with and was on BiPAP for acute respiratory distress.  Patient denies any chest pain dizziness lightheadedness this morning.  He is on nasal cannula.  Denies fever chills  Objective: Vitals:   08/02/18 0335 08/02/18 0428 08/02/18 0516 08/02/18 0800  BP: (!) 80/59 102/67  104/60  Pulse: 84 87 85 100  Resp: (!) 28 (!) 22 20 (!) 22  Temp:  Marland Kitchen)  97.1 F (36.2 C)  97.8 F (36.6 C)  TempSrc:  Oral  Oral  SpO2: 97% 99% 100% 99%  Weight:      Height:        Intake/Output Summary (Last 24 hours) at 08/02/2018 0955 Last data filed at 08/02/2018 0700 Gross per 24  hour  Intake 1428.58 ml  Output 825 ml  Net 603.58 ml   Filed Weights   08/02/2018 2059 Aug 28, 2018 0200  Weight: 59 kg 60.1 kg    Physical Examination: General exam: Alert awake and communicative.  Not in acute distress.  On nasal cannula.  Thinly built, frail HEENT:PERRL,Oral mucosa moist Respiratory system: Diminished breath sounds bilaterally.   Cardiovascular system: S1 & S2 heard, RRR.  Gastrointestinal system: Abdomen is nondistended, soft and nontender. No organomegaly or masses felt. Normal bowel sounds heard. Central nervous system: Alert and oriented. No focal neurological deficits. Extremities: No edema, no clubbing ,no cyanosis, distal peripheral pulses palpable. Skin: No rashes, lesions or ulcers,no icterus ,no pallor MSK: Thinly built.   Data Reviewed: I have personally reviewed following labs and imaging studies  CBC: Recent Labs  Lab 08/28/2018 2105 Aug 28, 2018 0646 08/02/18 0558  WBC 4.7 4.0 10.2  HGB 10.9* 10.3* 13.2  HCT 34.3* 32.1* 40.1  MCV 101.5* 100.9* 100.0  PLT 176 157 202   Basic Metabolic Panel: Recent Labs  Lab 08/24/2018 2105 08/28/2018 0646 08/02/18 0324  NA 139 140 140  K 4.0 3.5 3.8  CL 106 110 110  CO2 25 22 20*  GLUCOSE 108* 99 133*  BUN 19 13 16   CREATININE 0.99 0.73 1.02  CALCIUM 8.7* 8.3* 8.8*   GFR: Estimated Creatinine Clearance: 47.5 mL/min (by C-G formula based on SCr of 1.02 mg/dL). Liver Function Tests: No results for input(s): AST, ALT, ALKPHOS, BILITOT, PROT, ALBUMIN in the last 168 hours. No results for input(s): LIPASE, AMYLASE in the last 168 hours. No results for input(s): AMMONIA in the last 168 hours. Coagulation Profile: No results for input(s): INR, PROTIME in the last 168 hours. Cardiac Enzymes: Recent Labs  Lab 08/06/2018 2105 2018-08-28 0058 Aug 28, 2018 0646  TROPONINI 0.05* 0.05* 0.04*   BNP (last 3 results) No results for input(s): PROBNP in the last 8760 hours. HbA1C: No results for input(s): HGBA1C in the  last 72 hours. CBG: Recent Labs  Lab 08/21/2018 2105 08/02/18 0129  GLUCAP 86 156*   Lipid Profile: No results for input(s): CHOL, HDL, LDLCALC, TRIG, CHOLHDL, LDLDIRECT in the last 72 hours. Thyroid Function Tests: No results for input(s): TSH, T4TOTAL, FREET4, T3FREE, THYROIDAB in the last 72 hours. Anemia Panel: No results for input(s): VITAMINB12, FOLATE, FERRITIN, TIBC, IRON, RETICCTPCT in the last 72 hours. Sepsis Labs: Recent Labs  Lab 07/30/2018 2115 2018/08/28 0058  LATICACIDVEN 1.12 0.8    Recent Results (from the past 240 hour(s))  MRSA PCR Screening     Status: None   Collection Time: Aug 28, 2018  3:39 AM  Result Value Ref Range Status   MRSA by PCR NEGATIVE NEGATIVE Final    Comment:        The GeneXpert MRSA Assay (FDA approved for NASAL specimens only), is one component of a comprehensive MRSA colonization surveillance program. It is not intended to diagnose MRSA infection nor to guide or monitor treatment for MRSA infections. Performed at [facility], [address], [place], [place] [address]      Radiology Studies: Dg Chest Port 1 View  Result Date: 08/02/2018 CLINICAL DATA:  Lung crackles EXAM:  PORTABLE CHEST 1 VIEW COMPARISON:  07/29/2017 FINDINGS: Increased interstitial markings, right upper lobe predominant, favoring asymmetric interstitial edema. Multifocal infection is also possible. This appearance has mildly progressed. Possible small left pleural effusion.  No pneumothorax. Cardiomegaly. IMPRESSION: Cardiomegaly with increased interstitial markings, right upper lobe predominant, favoring asymmetric interstitial edema. Multifocal infection is also possible. This appearance has mildly progressed when compared to the prior. Electronically Signed   By: Charline Bills M.D.   On: 08/02/2018 01:45    Scheduled Meds: . aspirin EC  81 mg Oral Daily  . digoxin  0.125 mg Oral Daily  . enoxaparin (LOVENOX) injection  40 mg Subcutaneous Q24H  .  finasteride  5 mg Oral Daily  . rosuvastatin  10 mg Oral Q Fri  . sodium chloride flush  3 mL Intravenous Q12H   Continuous Infusions: . sodium chloride       LOS: 1 day   Time spent: More than 50% of that time was spent in counseling and/or coordination of care.  Joycelyn Das, MD [facility] Pager [phone]  If 7PM-7AM, please contact night-coverage [email or web address] Password [email or web address] 08/02/2018, 9:55 AM

## 2018-08-02 NOTE — Progress Notes (Signed)
Patient seen by respiratory  And commenced  Bipap , settled  With breathing on cpap but bps started decreasing . NP Natalia Leatherwood is presently reviewing patients status. Care continues .

## 2018-08-02 NOTE — Progress Notes (Addendum)
Shift event note:  Notified by RN that pt noted w/ increased WOB (RR-36-40) w/ 02 sats of 60-70's. Leon replaced w/ NRB which initially improved sats. Scattered crackles noted. Orders placed for stat PCXR and Lasix 40 mg IV. RR RN was paged and responded to bedside.  RR RN Haywood Lasso, RN) confirmed pt w/ increased WOB and use of accessory muscles. Very diaphoretic. Record reviewed briefly. Order placed to put pt on BiPAP. Review of CXR reveals findings c/w edema. At bedside pt is now calm on BiPAP and resting w/ eyes closed. He will open eyes to voice but has been confused and lethargic at baseline since admission. Sats have improved to 97% on BiPAP. SBP now in 70's since no longer agitated. Though while being observed BP improved to 92/49 w/o intervention which has been his baseline since admission. BBS diminished, now w/o appreciable crackles s/p lasix though no noted UOP. Skin now cool and dry.  Assessment/Plan: 1. Acute hypoxic respiratory failure: In setting of acute on chronic CHF w/ EF of 20%.PCI on 08/02/2018 revealed severe recurrent CAD w/ severe LV dysfunction. LAD and (R) coronary arteries poor targets for grafting. Pt deemed poor candidate for CABG d/t poor LV function and comorbidies (advanced age, fraility, Parkinson's, polyarthritis and anemia). Discussed w/ Dr Noemi Chapel w/ cardiology service who recommended pressors if needed to raise BP. Discussed pt w/ Dr Dellie Catholic w/ [facility] who is relunctant to start pressors given significant heart failure and notes current BP has been pt's baseline since admission s/p starting Entresto.  He has agreed to have pt evaluated by [facility] provider if BP worsens. Attempted to notify family of change in pt's status and confirm code status. Wife's phone is busy, and daughter "Johnnette Barrios" (who is making decisions) did not answer her phone so a message was left. At this time pt remains a Full Code. Order placed for Palliative Care consult for end of life GOC and Code status.  It  was agreed to monitor pt closely for now on BiPAP. Will contact Dr Dellie Catholic if BP continues to deteriorate. ABG scheduled for 0500. Will continue to monitor closley on SDU.   Leanne Chang, NP-C [facility] Pager [phone]   CRITICAL CARE Performed by: Leanne Chang   Total critical care time: 90 minutes  Critical care time was exclusive of separately billable procedures and treating other patients.  Critical care was necessary to treat or prevent imminent or life-threatening deterioration.  Critical care was time spent personally by me on the following activities: development of treatment plan with patient and/or surrogate as well as nursing, discussions with consultants, evaluation of patient's response to treatment, examination of patient, obtaining history from patient or surrogate, ordering and performing treatments and interventions, ordering and review of laboratory studies, ordering and review of radiographic studies, pulse oximetry and re-evaluation of patient's condition.

## 2018-08-03 DIAGNOSIS — R531 Weakness: Secondary | ICD-10-CM

## 2018-08-03 DIAGNOSIS — I255 Ischemic cardiomyopathy: Secondary | ICD-10-CM

## 2018-08-03 DIAGNOSIS — Z7189 Other specified counseling: Secondary | ICD-10-CM

## 2018-08-03 DIAGNOSIS — I5021 Acute systolic (congestive) heart failure: Secondary | ICD-10-CM

## 2018-08-03 DIAGNOSIS — Z515 Encounter for palliative care: Secondary | ICD-10-CM

## 2018-08-03 DIAGNOSIS — G20 Parkinson's disease: Secondary | ICD-10-CM

## 2018-08-03 LAB — BASIC METABOLIC PANEL
Anion gap: 8 (ref 5–15)
BUN: 20 mg/dL (ref 8–23)
CO2: 24 mmol/L (ref 22–32)
CREATININE: 0.97 mg/dL (ref 0.61–1.24)
Calcium: 8.6 mg/dL — ABNORMAL LOW (ref 8.9–10.3)
Chloride: 108 mmol/L (ref 98–111)
GFR calc Af Amer: >60 mL/min (ref >60)
GLUCOSE: 92 mg/dL (ref 70–99)
Potassium: 3.4 mmol/L — ABNORMAL LOW (ref 3.5–5.1)
SODIUM: 140 mmol/L (ref 135–145)

## 2018-08-03 LAB — CBC
HCT: 31.5 % — ABNORMAL LOW (ref 39.0–52.0)
Hemoglobin: 10.2 g/dL — ABNORMAL LOW (ref 13.0–17.0)
MCH: 32.6 pg (ref 26.0–34.0)
MCHC: 32.4 g/dL (ref 30.0–36.0)
MCV: 100.6 fL — AB (ref 78.0–100.0)
PLATELETS: 157 10*3/uL (ref 150–400)
RBC: 3.13 MIL/uL — AB (ref 4.22–5.81)
RDW: 14.6 % (ref 11.5–15.5)
WBC: 5.8 10*3/uL (ref 4.0–10.5)

## 2018-08-30 NOTE — Significant Event (Signed)
Rapid Response Event Note  Overview: Time Called: 1256 Arrival Time: 1258 Event Type: Cardiac  Initial Focused Assessment: Upon my arrival patient sitting in the chair pulseless and not breathing.  VF on monitor.  Patient is DNR.  Wife and daughter at bedside. 3 RNs auscultated heart tones. Myself, Bernadette, and Aminatou No heart tones Placed patient in the bed, Notified MD of patient death Candelaria Stagers called to bedside for family support  Interventions:  Plan of Care (if not transferred):  Event Summary: Name of Physician Notified: Irene Limbo    Outcome: Code status clarified  Event End Time: 1326  Marcellina Millin

## 2018-08-30 NOTE — Discharge Summary (Signed)
Death Summary  [name] [name] ZOX:[id number] DOB: [birth year] DOA: 08/14/2018  PCP: Gordan Payment., MD   Admit date: Aug 14, 2018 Date of Death: August 17, 2018  Final Diagnoses:  1. Ischemic cardiomyopathy, acute systolic CHF 2. Severe three-vessel CAD 3. Acute hypoxic respiratory failure 4. Parkinson's disease  History of present illness/Hospital Course:  84 year old man PMH Parkinson's disease, recently hospitalized at [facility] with discharge 2023/08/15, found to have new onset systolic CHF with LVEF 20-25% at that time.  Discharged on Entresto and carvedilol new medications, with plans for left and right heart cath at [facility] 10/3.  Presented 10/30 to the emergency department with generalized weakness, lightheadedness, near syncope after taking Entresto.  Found to be hypotensive with systolic in the 80s.  Admitted for hypotension thought secondary to [facility].  He underwent right and left heart catheterization which revealed severe three-vessel CAD with poor LV function, poor targets for grafting.  He was seen by cardiothoracic surgery who recommended against CABG.  He developed acute hypoxic respiratory failure secondary to CHF requiring BiPAP.  Cardiology recommended against aggressive care, recommended hospice care.  Patient was seen by palliative care, plans were made for full comfort care and hopeful transition to residential hospice, however patient developed ventricular arrhythmia and subsequently passed 10/5.     Time: 45 minutes  Signed:  Brendia Sacks, MD  [facility] 17-Aug-2018, 1:13 PM

## 2018-08-30 NOTE — Progress Notes (Signed)
I received a page from the nurse to visit with the family of the patient who passed. I visited with the patient's wife of 63 years and their daughter. I provided spiritual support by listening to them with compassion, by sharing words of encouragement, and by leading in prayer. I shared that the Chaplain is available to provide additional support as needed or requested.    08-09-2018 1300  Clinical Encounter Type  Visited With Family  Visit Type Spiritual support;Death  Referral From Nurse  Consult/Referral To Chaplain  Spiritual Encounters  Spiritual Needs Prayer;Grief support  Stress Factors  Family Stress Factors Exhausted;Loss    Chaplain Dr Melvyn Novas

## 2018-08-30 NOTE — Progress Notes (Addendum)
PROGRESS NOTE  [name] [name] QMV:[id number] DOB: 08/10/1934 DOA: 08/11/2018 PCP: Gordan Payment., MD  Brief Narrative: 84 year old man PMH Parkinson's disease, recently hospitalized at [facility] with discharge 10/2, found to have new onset systolic CHF with LVEF 20-25% at that time.  Discharged on Entresto and carvedilol new medications, with plans for left and right heart cath at [facility] 10/3.  Presented 10/30 to the emergency department with generalized weakness, lightheadedness, near syncope after taking Entresto.  Found to be hypotensive with systolic in the 80s.  Admitted for hypotension thought secondary to [facility].  He underwent right and left heart catheterization which revealed severe three-vessel CAD with poor LV function, poor targets for grafting.  He was seen by cardiothoracic surgery who recommended against CABG.  He developed acute hypoxic respiratory failure secondary to CHF requiring BiPAP.  Cardiology recommended against aggressive care, recommended hospice care.  Assessment/Plan Ischemic cardiomyopathy, acute systolic CHF, new diagnosis, secondary to severe three-vessel CAD.  Underwent catheterization, not felt to be a candidate for CABG for aggressive intervention.  Hospice has been recommended by cardiology. --Has had borderline hypotension which has limited management.  Initially thought secondary to [facility]. --Not a candidate for Entresto, spironolactone or ongoing Lasix. --Continue digoxin, aspirin, Crestor  Acute hypoxic respiratory failure secondary to acute CHF, appears resolved at this point.  Did require BiPAP earlier in the hospitalization. --Continue supportive care based on goals of care.  Oxygen as needed.  Severe three-vessel CAD --Medical management as above  Parkinson's disease --Not on any medications as an outpatient   Ambulated 60 feet with PT today.  Seems to have improved after respiratory distress yesterday.  Cardiology has recommended hospice  care.  Will continue medical management.  I have discussed with palliative medicine, as well as wife and 2 daughters at bedside.  Plan is for comfort care and residential hospice when bed is available.  Will downgrade to med-surg  DVT prophylaxis: enoxaparin Code Status: DNR/DNI Family Communication: wife, daughters Disposition Plan: residential hospice    Brendia Sacks, MD  [facility] Direct contact: [phone] --Via amion app OR  --[email or web address]; password [email or web address]  7PM-7AM contact night coverage as above 08/06/2018, 10:31 AM  LOS: 2 days   Consultants:  Heart failure team  TCTS  Procedures:  R/LHC Assessment: 1. 3v CAD with subtotally occluded proximal LAD with TIMI 1-2 flow distally. Lesion is heavily calcified. 2. Ostial 70% RCA with totally occluded ostial RPDA with left to right collaterals 3. Patent LCX stent 4. Ischemic CM LVEF 20-25%  Antimicrobials:    Interval history/Subjective: Feels fine, breathing fine. D/w RN, ate good breakfast, walked in hall on RA, doing well. Has not required pain medicine.  Objective: Vitals:  Vitals:   08/25/2018 0400 08/09/2018 0737  BP: (!) 84/47 (!) 89/47  Pulse: 61 61  Resp: 12 14  Temp:  98.3 F (36.8 C)  SpO2: 99% 98%    Exam:  Constitutional:  . Appears calm and comfortable sitting in chair Respiratory:  . CTA bilaterally, no w/r/r.  . Respiratory effort normal.  Cardiovascular:  . RRR, no m/r/g . No LE extremity edema   . Telemetry SR Psychiatric:  . Mental status o Mood, affect appropriate  I have personally reviewed the following:   Data: . I/O   o UOP: 475. +5.7 L since admission o BM: None recorded since 10/2  CBG: Stable.  No lows.  Labs: Potassium 3.4, remainder BMP unremarkable.  Hemoglobin stable 10.2.  Remainder CBC unremarkable.  Imaging: MRI cardiac  study was limited secondary to patient intolerance.  Moderate bilateral pleural effusions, diffuse hypokinesis of the left  ventricle with LVEF 17%.  Normal RV size and systolic function.  Other: Cath and EKG reviewed  Scheduled Meds: . aspirin EC  81 mg Oral Daily  . digoxin  0.125 mg Oral Daily  . enoxaparin (LOVENOX) injection  40 mg Subcutaneous Q24H  . finasteride  5 mg Oral Daily  . rosuvastatin  10 mg Oral Q Fri  . sodium chloride flush  3 mL Intravenous Q12H   Continuous Infusions: . sodium chloride      Principal Problem:   Acute systolic CHF (congestive heart failure) (HCC) Active Problems:   Coronary artery disease involving native coronary artery of native heart with angina pectoris (HCC)   Hypertensive heart disease   Parkinson's disease (HCC)   Hypotension due to drugs   Ischemic cardiomyopathy   LOS: 2 days     Time spent 35 minutes, greater than 50% in counseling, coordination of care as documented above

## 2018-08-30 NOTE — Consult Note (Signed)
Consultation Note Date: 08-12-2018   Patient Name: [name] [name]  DOB: Mar 31, 1934  MRN: [id number]  Age / Sex: 84 y.o., male  PCP: Raina Mina., MD Referring Physician: Samuella Cota, MD  Reason for Consultation: Establishing goals of care  HPI/Patient Profile: 84 y.o. male   admitted on 08/06/2018    Clinical Assessment and Goals of Care:  84 year old gentleman who lives at home with his wife, has 2 daughters, past medical history of Parkinson's disease. Patient was recently hospitalized at [facility] for new onset systolic congestive heart failure with known ejection fraction 20-25 percent. Patient was placed on new cardiac medications and plans were made for the patient to undergo cardiac catheterization at [facility].  Patient admitted to the hospital with generalized weakness lightheadedness and near syncope.  Patient underwent cardiac catheterization right side and left side of the heart which revealed severe three-vessel coronary artery disease, poor left ventricular function, poor targets for grafting. He was seen by cardiothoracic surgery and deemed not an appropriate candidate to undergo CABG.  Hospital course further complicated by development of acute hypoxic respiratory failure secondary to congestive heart failure.  Patient was seen and evaluated by cardiology with final recommendations to pursue hospice care.  Palliative consultation for further goals of care discussions.  Patient is an elderly gentleman resting in bed. He is off the BiPAP. His wife and 2 daughters are present at the bedside. I introduced myself and palliative care as follows:  Palliative medicine is specialized medical care for people living with serious illness. It focuses on providing relief from the symptoms and stress of a serious illness. The goal is to improve quality of life for both the patient and the  family.  Patient's acute and underlying conditions discussed in detail. Disease trajectory based on heart failure as well as patient's underlying history of Parkinson's disease, his cachexia discussed in detail. CODE STATUS discussions also undertaken in detail. Discussed extensively with patient and family about full code versus DO NOT RESUSCITATE/DO NOT INTUBATE. Additionally, introduced hospice philosophy of care, in particular, the type of care that can be provided inside hospice facility also discussed in detail.  To a large extent, the patient is able to listen intently and answer appropriately. He states that this is not the outcome he hoped for. He would rather go back home. He also wishes that he would get better.  CODE STATUS has been established as DO NOT RESUSCITATE/DO NOT INTUBATE. I also met with the hospice liaison and we briefly discussed. Appreciate hospice follow-up here at the hospital and hopefully anticipate patient being considered a candidate for residential hospice.  HCPOA Wife of 61 years Dianna at bedside.  2 daughters, both are at bedside, one of them is Ned Card [phone]  Also has grand children He is from [place], [place]   1. Code status now established as DNR DNI 2. Consider residential hospice on discharge 3. Focus to shift on comfort measures.  Thank you  for the consult.   Code Status/Advance Care Planning:  DNR    Symptom Management:    may need prn medications for symptom management at the hospice facility.   Palliative Prophylaxis:   Delirium Protocol  Additional Recommendations (Limitations, Scope, Preferences):  Full Comfort Care  Psycho-social/Spiritual:   Desire for further Chaplaincy support:yes  Additional Recommendations: Education on Hospice  Prognosis:   < 2-3 weeks  Discharge Planning: Hospice facility      Primary Diagnoses: Present on Admission: . Chronic combined systolic  (congestive) and diastolic (congestive) heart failure ([place]) . Coronary artery disease involving native coronary artery of native heart with angina pectoris ([place]) . Hypertensive heart disease . Hypotension due to drugs   I have reviewed the medical record, interviewed the patient and family, and examined the patient. The following aspects are pertinent.  Past Medical History:  Diagnosis Date  . Acute cystitis with hematuria 06/11/2018  . Altered mental status 04/15/2016  . Anemia, chronic disease 07/17/2017  . At high risk for falls 12/24/2017  . Atherosclerotic heart disease of native coronary artery without angina pectoris 01/31/2016   1 stent 2006.  Neg ST 07/2011.  [place].  Overview:  PCI and DES to [facility] for unstable angina in 2006 Stress echo negative for ischemia at 5 mets Oct 2014 with EF 45-50% and inferior hypokinesia at rest Overview:  PCI and DES to [facility] for unstable angina in 2006 Stress echo negative for ischemia at 5 mets Oct 2014 with EF 45-50% and inferior hypokinesia at rest  . Benign prostatic hyperplasia 03/06/2016  . Corrosive esophagitis 03/06/2016   Overview:  Overview:  Schotskis ring  Overview:  Schotskis ring  . Dyslipidemia 01/31/2016  . Encephalopathy acute 06/11/2018  . High risk medication use 03/06/2016  . Hyperlipidemia 07/31/2011  . Hypertension 07/31/2011  . Hypertensive heart disease 01/31/2016  . Increased band cell count 04/15/2016  . Malaise and fatigue 12/24/2017  . Neurogenic bladder 03/08/2018   Follows up regularly with Dr. Joie Bimler  . Obstructive sleep apnea 07/31/2011  . Parkinson's disease ([place]) 08/03/2011   Choses not to see neurology  . Polyosteoarthritis 03/06/2016  . Prediabetes 03/09/2016  . Sepsis ([place]) 04/15/2016  . Sinus bradycardia 01/31/2016   [place].  . Urinary retention 06/11/2018  . Urinary tract infection 04/15/2016   Social History   Socioeconomic History  . Marital status: Married    Spouse name: Not on file  . Number of children: Not on file    . Years of education: Not on file  . Highest education level: Not on file  Occupational History  . Not on file  Social Needs  . Financial resource strain: Not on file  . Food insecurity:    Worry: Not on file    Inability: Not on file  . Transportation needs:    Medical: Not on file    Non-medical: Not on file  Tobacco Use  . Smoking status: Never Smoker  . Smokeless tobacco: Never Used  Substance and Sexual Activity  . Alcohol use: Not Currently  . Drug use: Not Currently  . Sexual activity: Not on file  Lifestyle  . Physical activity:    Days per week: Not on file    Minutes per session: Not on file  . Stress: Not on file  Relationships  . Social connections:    Talks on phone: Not on file    Gets together: Not on file    Attends religious service: Not on file  Active member of club or organization: Not on file    Attends meetings of clubs or organizations: Not on file    Relationship status: Not on file  Other Topics Concern  . Not on file  Social History Narrative  . Not on file   Family History  Problem Relation Age of Onset  . Heart Problems Mother   . Bradycardia Mother   . Colon cancer Father   . Cancer Sister    Scheduled Meds: . aspirin EC  81 mg Oral Daily  . digoxin  0.125 mg Oral Daily  . enoxaparin (LOVENOX) injection  40 mg Subcutaneous Q24H  . finasteride  5 mg Oral Daily  . rosuvastatin  10 mg Oral Q Fri  . sodium chloride flush  3 mL Intravenous Q12H   Continuous Infusions: . sodium chloride     PRN Meds:.sodium chloride, acetaminophen, ondansetron (ZOFRAN) IV, ondansetron **OR** [DISCONTINUED] ondansetron (ZOFRAN) IV, sodium chloride flush Medications Prior to Admission:  Prior to Admission medications   Medication Sig Start Date End Date Taking? Authorizing Provider  acetaminophen (TYLENOL) 325 MG tablet Take 325 mg by mouth every 6 (six) hours as needed (for pain).    Yes [provider]  aspirin EC 81 MG tablet Take 81 mg  by mouth daily.    Yes [provider]  finasteride (PROSCAR) 5 MG tablet Take 5 mg by mouth daily. 01/31/18  Yes [provider]  nitroGLYCERIN (NITROSTAT) 0.4 MG SL tablet Place 0.4 mg under the tongue every 5 (five) minutes x 3 doses as needed for chest pain.  03/07/17  Yes [provider]  rosuvastatin (CRESTOR) 10 MG tablet Take 1 tablet (10 mg total) by mouth once a week. Patient taking differently: Take 10 mg by mouth every Friday.  06/18/18  Yes Richardo Priest, MD  tamsulosin (FLOMAX) 0.4 MG CAPS capsule Take 0.4 mg by mouth daily. 01/31/18  Yes [provider]  vitamin B-12 (CYANOCOBALAMIN) 1000 MCG tablet Take 1,000 mcg by mouth daily.   Yes [provider]  carvedilol (COREG) 3.125 MG tablet Take 3.125 mg by mouth 2 (two) times daily.    [provider]   Allergies  Allergen Reactions  . Entresto [Sacubitril-Valsartan]     Seems to have worked too well.  Admitting hypotension, lightheadedness less than 24h after first dose.  . Gemfibrozil     Headaches  . Lisinopril Cough  . Niacin Other (See Comments)    "red flushing sensation"   Review of Systems + some shortness of breath at times Has weakness  Physical Exam Awake alert sitting comfortably Shallow. Breath sounds S1-S2 No edema Thin and has muscle wasting Appears frail and weak  Vital Signs: BP (!) 89/47 (BP Location: Right Arm)   Pulse 61   Temp 98.3 F (36.8 C) (Oral)   Resp 14   Ht _0  (1.727 m)   Wt 60.1 kg   SpO2 98%   BMI 20.15 kg/m  Pain Scale: 0-10 POSS *See Group Information*: 1-Acceptable,Awake and alert Pain Score: 0-No pain   SpO2: SpO2: 98 % O2 Device:SpO2: 98 % O2 Flow Rate: .O2 Flow Rate (L/min): 3 L/min  IO: Intake/output summary:   Intake/Output Summary (Last 24 hours) at 2018-08-31 1015 Last data filed at Aug 31, 2018 0700 Gross per 24 hour  Intake 120 ml  Output 475 ml  Net -355 ml    LBM: Last BM Date: 08/20/2018 Baseline Weight:  Weight: 59 kg Most recent weight: Weight: 60.1 kg  Palliative Assessment/Data:   PPS 30%  Time In:  9   Time Out:  10.10  Time Total:  70 Greater than 50%  of this time was spent counseling and coordinating care related to the above assessment and plan.  Signed by: Loistine Chance, MD [phone]   Please contact Palliative Medicine Team phone at [phone] for questions and concerns.  For individual provider: See Shea Evans

## 2018-08-30 NOTE — Evaluation (Addendum)
Physical Therapy Evaluation Patient Details Name: [name] [name] MRN: [id number] DOB: Mar 01, 1934 Today's Date: 08/23/2018   History of Present Illness  Patient is a 84 years old male with past medical history significant for hypertension, coronary artery disease was recently admitted to [facility] between 07/29/2018 to 10/2//2019 with complaints of shortness of breath.  He was diagnosed to have new onset congestive heart failure with ejection fraction 20 to 25%.  He was treated with Lasix and was discharged home to follow-up with left heart catheterization scheduled for 2018/08/10 at [facility] hospital.  Patient was discharged on Entresto and Coreg and the patient had taken 1 dose of Entresto.  After that, he developed generalized weakness, lightheadedness and near syncope and was hypotensive with 80s systolic and presented to the ED in the evening before the scheduled cath on 08/10/18. In the ED, patient received boluses of normal saline and was admitted to the hospital for further observation and evaluation..    Clinical Impression  Pt admitted with above diagnosis. Pt currently with functional limitations due to the deficits listed below (see PT Problem List). PTA living with wife, intermittent use of SPC with hx of several falls per family. Pt today able to ambulate hallway with contact guard. Pt could benefit from continued mobility as family and team decide what course to take with palliative care.   Pt will benefit from skilled PT to increase their independence and safety with mobility to allow discharge to the venue listed below.     Vitals BP: 106/80 standing SpO2 WNL on RA with activity HR: 60-70 with activity    Follow Up Recommendations SNF;Other (comment);Supervision/Assistance - 24 hour(SNF or PT if offered at residential Hospice per CM note. )    Equipment Recommendations  None recommended by PT    Recommendations for Other Services OT consult     Precautions / Restrictions  Restrictions Weight Bearing Restrictions: No      Mobility  Bed Mobility Overal bed mobility: Needs Assistance Bed Mobility: Supine to Sit     Supine to sit: Supervision        Transfers Overall transfer level: Needs assistance Equipment used: Rolling walker (2 wheeled) Transfers: Sit to/from Stand Sit to Stand: Min assist            Ambulation/Gait Ambulation/Gait assistance: Min guard Gait Distance (Feet): 60 Feet Assistive device: Rolling walker (2 wheeled) Gait Pattern/deviations: Step-to pattern;Steppage;Shuffle Gait velocity: decreased   General Gait Details: pt with short step, shuffling gait, SpO2 WNL on RA no DOE, patient using walker with chair follow and mostly contact guard.   Stairs            Wheelchair Mobility    Modified Rankin (Stroke Patients Only)       Balance Overall balance assessment: Needs assistance   Sitting balance-Leahy Scale: Fair       Standing balance-Leahy Scale: Poor                               Pertinent Vitals/Pain Pain Assessment: No/denies pain    Home Living Family/patient expects to be discharged to:: Skilled nursing facility                      Prior Function Level of Independence: Independent with assistive device(s)         Comments: family reports intermittent use of SPC at home, usually helping take care of wife with dementia.  family reports falls.      Hand Dominance        Extremity/Trunk Assessment   Upper Extremity Assessment Upper Extremity Assessment: Generalized weakness    Lower Extremity Assessment Lower Extremity Assessment: Generalized weakness       Communication   Communication: No difficulties  Cognition Arousal/Alertness: Awake/alert Behavior During Therapy: Flat affect                                   General Comments: following all commands 100% of time, flat affet       General Comments      Exercises      Assessment/Plan    PT Assessment Patient needs continued PT services  PT Problem List Decreased strength;Decreased balance;Cardiopulmonary status limiting activity       PT Treatment Interventions Gait training;DME instruction;Stair training;Therapeutic activities;Functional mobility training;Therapeutic exercise    PT Goals (Current goals can be found in the Care Plan section)  Acute Rehab PT Goals Patient Stated Goal: sit out of bed for a while PT Goal Formulation: With patient Time For Goal Achievement: 08/17/18 Potential to Achieve Goals: Poor    Frequency Min 3X/week   Barriers to discharge Decreased caregiver support      Co-evaluation               AM-PAC PT "6 Clicks" Daily Activity  Outcome Measure Difficulty turning over in bed (including adjusting bedclothes, sheets and blankets)?: Unable Difficulty moving from lying on back to sitting on the side of the bed? : Unable Difficulty sitting down on and standing up from a chair with arms (e.g., wheelchair, bedside commode, etc,.)?: Unable Help needed moving to and from a bed to chair (including a wheelchair)?: A Lot Help needed walking in hospital room?: A Lot Help needed climbing 3-5 steps with a railing? : A Lot 6 Click Score: 9    End of Session Equipment Utilized During Treatment: Gait belt Activity Tolerance: Patient limited by fatigue Patient left: in chair;with call bell/phone within reach;with nursing/sitter in room;with family/visitor present Nurse Communication: Mobility status PT Visit Diagnosis: Unsteadiness on feet (R26.81);Muscle weakness (generalized) (M62.81)    Time: 0272-5366 PT Time Calculation (min) (ACUTE ONLY): 28 min   Charges:   PT Evaluation $PT Eval Moderate Complexity: 1 Mod PT Treatments $Gait Training: 8-22 mins        Etta Grandchild, PT, DPT Acute Rehabilitation Services Pager: [phone] Office: [phone]    Etta Grandchild 08/26/2018, 10:16 AM

## 2018-08-30 NOTE — Progress Notes (Signed)
Patient ID: [name] [name], male   DOB: 12-11-1933, 84 y.o.   MRN: [id number]  Discussed patient with Dr. Irene Limbo: plan for residential hospice when bed is available.  At this time, will aim for comfort care.  I have nothing to add at this time, cardiology will be available as needed.   Marca Ancona 08/12/2018 10:37 AM

## 2018-08-30 DEATH — deceased

## 2019-09-23 IMAGING — DX DG CHEST 1V PORT
1 series · 1 of 1 positions shown · non-contrast
Comparison: 07/29/2017

CLINICAL DATA: Lung crackles

EXAM:
PORTABLE CHEST 1 VIEW

[chest ap]
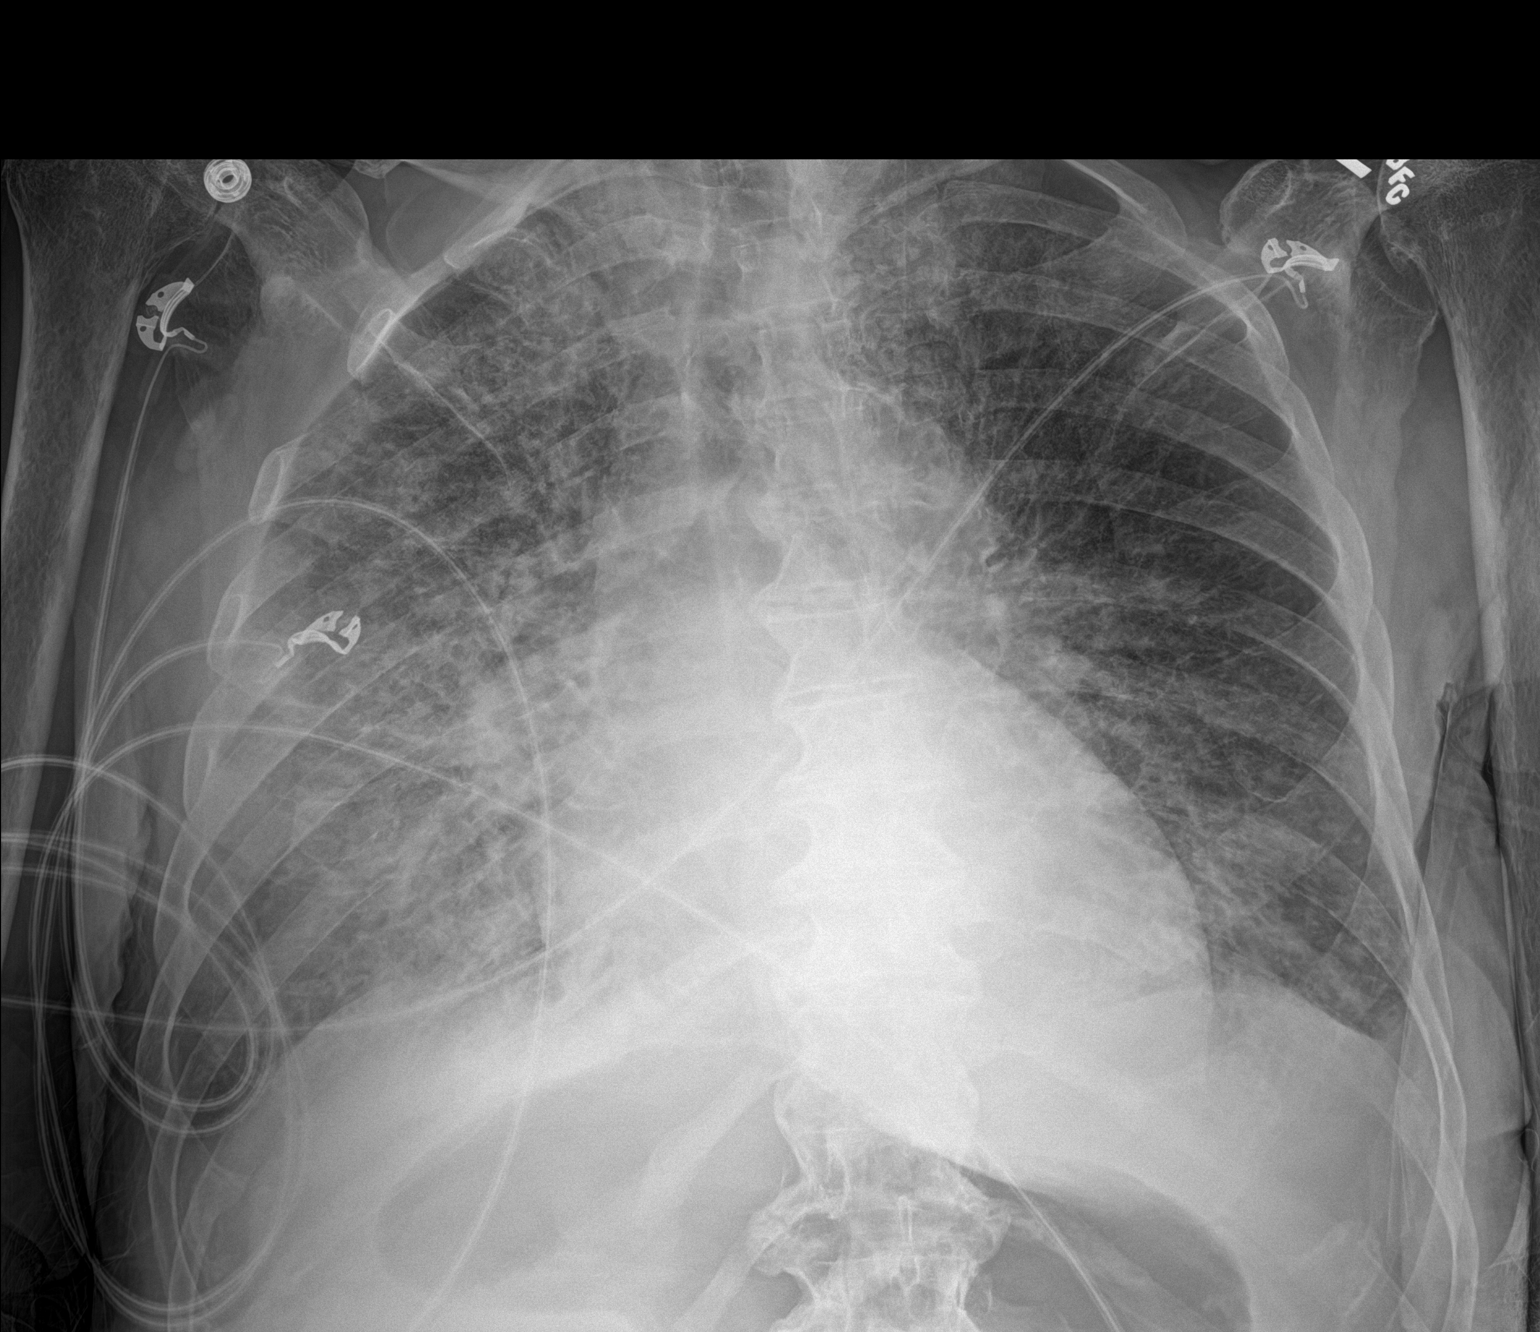

[1 of 1 positions shown; findings below may reference images not displayed]

FINDINGS: Increased interstitial markings, right upper lobe predominant,
favoring asymmetric interstitial edema. Multifocal infection is also
possible. This appearance has mildly progressed.

Possible small left pleural effusion.  No pneumothorax.

Cardiomegaly.
IMPRESSION: Cardiomegaly with increased interstitial markings, right upper lobe
predominant, favoring asymmetric interstitial edema. Multifocal
infection is also possible.

This appearance has mildly progressed when compared to the prior.
